# Patient Record
Sex: Female | Born: 1965 | Race: White | Hispanic: No | Marital: Married | Smoking: Never smoker
Health system: Southern US, Community
[De-identification: ages and names within clinical notes are randomized; demographics above are authoritative.]

## PROBLEM LIST (undated history)

## (undated) DIAGNOSIS — G43909 Migraine, unspecified, not intractable, without status migrainosus: Secondary | ICD-10-CM

## (undated) DIAGNOSIS — F32A Depression, unspecified: Secondary | ICD-10-CM

## (undated) DIAGNOSIS — N200 Calculus of kidney: Secondary | ICD-10-CM

## (undated) DIAGNOSIS — L509 Urticaria, unspecified: Secondary | ICD-10-CM

## (undated) DIAGNOSIS — F329 Major depressive disorder, single episode, unspecified: Secondary | ICD-10-CM

## (undated) DIAGNOSIS — IMO0002 Reserved for concepts with insufficient information to code with codable children: Principal | ICD-10-CM

## (undated) DIAGNOSIS — M810 Age-related osteoporosis without current pathological fracture: Secondary | ICD-10-CM

## (undated) DIAGNOSIS — L80 Vitiligo: Secondary | ICD-10-CM

## (undated) HISTORY — DX: Urticaria, unspecified: L50.9

## (undated) HISTORY — DX: Depression, unspecified: F32.A

## (undated) HISTORY — DX: Vitiligo: L80

## (undated) HISTORY — DX: Calculus of kidney: N20.0

## (undated) HISTORY — DX: Age-related osteoporosis without current pathological fracture: M81.0

## (undated) HISTORY — DX: Major depressive disorder, single episode, unspecified: F32.9

## (undated) HISTORY — DX: Migraine, unspecified, not intractable, without status migrainosus: G43.909

## (undated) HISTORY — DX: Reserved for concepts with insufficient information to code with codable children: IMO0002

---

## 1987-01-16 HISTORY — PX: OTHER SURGICAL HISTORY: SHX169

## 1988-01-16 HISTORY — PX: TUBAL LIGATION: SHX77

## 2001-02-20 ENCOUNTER — Other Ambulatory Visit: Admission: RE | Admit: 2001-02-20 | Discharge: 2001-02-20 | Payer: Self-pay | Admitting: Gynecology

## 2003-10-29 ENCOUNTER — Emergency Department (HOSPITAL_COMMUNITY): Admission: EM | Admit: 2003-10-29 | Discharge: 2003-10-29 | Payer: Self-pay

## 2004-11-16 ENCOUNTER — Encounter: Admission: RE | Admit: 2004-11-16 | Discharge: 2004-11-16 | Payer: Self-pay | Admitting: Family Medicine

## 2004-11-23 ENCOUNTER — Other Ambulatory Visit: Admission: RE | Admit: 2004-11-23 | Discharge: 2004-11-23 | Payer: Self-pay | Admitting: Gynecology

## 2006-02-22 ENCOUNTER — Other Ambulatory Visit: Admission: RE | Admit: 2006-02-22 | Discharge: 2006-02-22 | Payer: Self-pay | Admitting: Gynecology

## 2007-03-05 ENCOUNTER — Other Ambulatory Visit: Admission: RE | Admit: 2007-03-05 | Discharge: 2007-03-05 | Payer: Self-pay | Admitting: Gynecology

## 2008-03-11 ENCOUNTER — Ambulatory Visit: Payer: Self-pay | Admitting: Women's Health

## 2008-03-11 ENCOUNTER — Other Ambulatory Visit: Admission: RE | Admit: 2008-03-11 | Discharge: 2008-03-11 | Payer: Self-pay | Admitting: Gynecology

## 2008-03-11 ENCOUNTER — Encounter: Payer: Self-pay | Admitting: Women's Health

## 2008-03-23 ENCOUNTER — Ambulatory Visit: Payer: Self-pay | Admitting: Gynecology

## 2008-05-15 HISTORY — PX: VAGINAL HYSTERECTOMY: SUR661

## 2008-05-26 ENCOUNTER — Ambulatory Visit: Payer: Self-pay | Admitting: Gynecology

## 2008-05-31 ENCOUNTER — Encounter: Payer: Self-pay | Admitting: Gynecology

## 2008-05-31 ENCOUNTER — Ambulatory Visit: Payer: Self-pay | Admitting: Gynecology

## 2008-05-31 ENCOUNTER — Ambulatory Visit (HOSPITAL_BASED_OUTPATIENT_CLINIC_OR_DEPARTMENT_OTHER): Admission: RE | Admit: 2008-05-31 | Discharge: 2008-06-01 | Payer: Self-pay | Admitting: Gynecology

## 2008-06-15 ENCOUNTER — Ambulatory Visit: Payer: Self-pay | Admitting: Gynecology

## 2008-06-29 ENCOUNTER — Ambulatory Visit: Payer: Self-pay | Admitting: Gynecology

## 2009-01-27 ENCOUNTER — Ambulatory Visit: Payer: Self-pay | Admitting: Gynecology

## 2009-10-04 ENCOUNTER — Encounter: Admission: RE | Admit: 2009-10-04 | Discharge: 2009-10-04 | Payer: Self-pay | Admitting: Family Medicine

## 2009-12-19 ENCOUNTER — Other Ambulatory Visit
Admission: RE | Admit: 2009-12-19 | Discharge: 2009-12-19 | Payer: Self-pay | Source: Home / Self Care | Admitting: Gynecology

## 2009-12-19 ENCOUNTER — Ambulatory Visit: Payer: Self-pay | Admitting: Women's Health

## 2010-04-25 LAB — DIFFERENTIAL
Basophils Absolute: 0 10*3/uL (ref 0.0–0.1)
Basophils Relative: 0 % (ref 0–1)
Eosinophils Absolute: 0 10*3/uL (ref 0.0–0.7)
Eosinophils Relative: 0 % (ref 0–5)
Lymphocytes Relative: 19 % (ref 12–46)
Lymphs Abs: 1.7 10*3/uL (ref 0.7–4.0)
Monocytes Absolute: 0.6 10*3/uL (ref 0.1–1.0)
Monocytes Relative: 7 % (ref 3–12)
Neutro Abs: 6.3 10*3/uL (ref 1.7–7.7)
Neutrophils Relative %: 73 % (ref 43–77)

## 2010-04-25 LAB — CBC
HCT: 30.7 % — ABNORMAL LOW (ref 36.0–46.0)
Hemoglobin: 10.3 g/dL — ABNORMAL LOW (ref 12.0–15.0)
MCHC: 33.5 g/dL (ref 30.0–36.0)
MCV: 87.8 fL (ref 78.0–100.0)
Platelets: 198 10*3/uL (ref 150–400)
RBC: 3.49 MIL/uL — ABNORMAL LOW (ref 3.87–5.11)
RDW: 13.6 % (ref 11.5–15.5)
WBC: 8.6 10*3/uL (ref 4.0–10.5)

## 2010-05-30 NOTE — Op Note (Signed)
NAMECARIZMA, DUNSWORTH                 ACCOUNT NO.:  [id number]   MEDICAL RECORD NO.:  [id number]          PATIENT TYPE:  AMB   LOCATION:  [facility]                         FACILITY:  [facility]   PHYSICIAN:  Timothy P. Fontaine, M.D.DATE OF BIRTH:  Feb 16, 1965   DATE OF PROCEDURE:  05/31/2008  DATE OF DISCHARGE:                               OPERATIVE REPORT   PREOPERATIVE DIAGNOSES:  Menorrhagia, leiomyoma.   POSTOPERATIVE DIAGNOSES:  Menorrhagia, leiomyoma.   PROCEDURE:  Laparoscopic-assisted vaginal hysterectomy.   SURGEON:  Timothy P. Fontaine, M.D.   ASSISTANTGaetano Hawthorne. Lily Peer, M.D.   ANESTHETIC:  General, 0.25% Marcaine skin injection.   SPECIMEN:  Uterus, surgical weight 340 grams.   ESTIMATED BLOOD LOSS:  Approximately 200 mL.   COMPLICATIONS:  None.   FINDINGS:  EUA:  External BUS, vagina normal.  Cervix normal, uterus  bulky, consistent with leiomyoma, 12 weeks' size.  Adnexa without  masses.  Surgical:  Anterior cul-de-sac normal.  Posterior cul-de-sac  normal.  Uterus enlarged, deformed by multiple myomas.  No serosal  pathology.  Fallopian tubes with evidence of prior tubal sterilization,  otherwise normal.  Right and left ovaries grossly normal, free and  mobile.  No evidence of pelvic adhesive disease or endometriosis.  Upper  abdominal exam was normal without evidence of adhesive disease.   PROCEDURE:  The patient was taken to the operating room, underwent  general anesthesia, was placed in the low dorsal lithotomy position,  received an abdominal/perineal/vaginal preparation with Betadine  solution.  Bladder emptied with indwelling Foley catheterization.  EUA  performed and a Hulka tenaculum placed on the cervix.  The patient was  draped in the usual fashion and the abdomen was directly entered using  the 10-mm Optiview direct entry trocar under direct visualization  without difficulty and the abdomen was subsequently insufflated.  Right  and left 5-mm suprapubic  ports were then placed under direct  visualization after transillumination of the vessels without difficulty.  Examination of pelvic organs was then carried out with findings noted  above.  Using the Harmonic scalpel, the left uterine ovarian pedicle was  identified and transected without difficulty.  The parametrial broad  ligament was likewise transected and ultimately the left round ligament  was transected using the Harmonic scalpel.  The anterior vesicouterine  peritoneal fold was then sharply developed using the Harmonic scalpel  across the anterior lower uterine segment.  A similar procedure was then  carried out on the other side, meeting the peritoneal incisions in the  midline anteriorly.  Attention was then turned to the vaginal portion of  the case.  The patient was placed in the high dorsal lithotomy position,  Hulka tenaculum removed, weighted speculum placed.  The cervix grasped  with a tenaculum and the cervical mucosa was then circumferentially  injected using lidocaine and epinephrine mixture.  A total of 8 mL was  used.  The cervical mucosa was then circumferentially incised and the  paracervical plane sharply developed.  The posterior cul-de-sac was then  sharply entered without difficulty and a long weighted speculum was  placed.  The  right and left uterosacral ligaments were identified,  clamped, cut and ligated using 0 Vicryl suture and tagged for future  reference.  The anterior vesicouterine plane was developed sharply and  the anterior cul-de-sac was ultimately entered without difficulty.  The  cardinal ligaments, paracervical and parametrial tissues were then  clamped, cut and ligated bilaterally using 0 Vicryl suture,  progressively freeing the uterus.  After ligation of the uterine  vessels, it became evident that the uterus would need to be morcellated  to be removed from the patient, and the cervix was initially cored and  removed, and ultimately through  morcellation the uterus was removed in  pieces.  The remaining uterine attachments were clamped, cut and ligated  using 0 Vicryl suture, and the total uterus was ultimately removed from  the patient.  The long weighted speculum was replaced with the shorter  weighted speculum and the posterior vaginal cuff was run from  uterosacral ligament to uterosacral ligament using a 0 Vicryl suture in  a running interlocking stitch.  A tagged tail sponge was placed to pack  the intestines from the posterior cul-de-sac.  All pedicles were  inspected showing adequate hemostasis and the tail sponge was removed,  and ultimately the vagina was closed anterior to posterior using 0  Vicryl suture in interrupted figure-of-eight stitch.  Hemostasis was  visualized vaginally.  Attention was then redirected to the laparoscopic  portion.  The surgeon and scrubs regloved.  The abdomen was  reinsufflated and the pelvis was reinspected and irrigated.  Several  small oozing points along the vaginal cuff were bipolar cauterized  without difficulty.  All pedicles and cuff were reinspected under low  pressure situation showing adequate hemostasis.  The right and left 5-mm  ports were removed and again hemostasis visualized under low pressure  situation.  The 10-mm umbilical port was then backed out under direct  visualization, showing adequate hemostasis and no evidence of hernia  formation.  The 0 Vicryl interrupted subcutaneous fascial stitch was  placed infraumbilically and all skin incisions were closed using 4-0  plain suture in simple cuticular stitch.  The skin incisions were all  injected using 0.25% Marcaine.  The patient received intraoperative  Toradol.  She was awakened without difficulty and taken to the recovery  room in good condition, having tolerated the procedure well.      Timothy P. Fontaine, M.D.  Electronically Signed     TPF/MEDQ  D:  05/31/2008  T:  05/31/2008  Job:  [id number]

## 2010-05-30 NOTE — H&P (Signed)
Brianna Wolf, Brianna Wolf                 ACCOUNT NO.:  [id number]   MEDICAL RECORD NO.:  [id number]          PATIENT TYPE:  AMB   LOCATION:  [facility]                         FACILITY:  [facility]   PHYSICIAN:  Timothy P. Fontaine, M.D.DATE OF BIRTH:  Mar 07, 1965   DATE OF ADMISSION:  DATE OF DISCHARGE:                              HISTORY & PHYSICAL   CHIEF COMPLAINT:  Menorrhagia.   HISTORY OF PRESENT ILLNESS:  A 45 year old G35, P3 female status post  tubal sterilization presents with history of worsening menorrhagia  leading to 7 days of heavy bleeding, bleed through episodes, double  protection that socially unacceptable.  Outpatient evaluation included  sonohystogram which showed multiple myomas, the largest measuring 66 mm,  negative intracavitary defects and a negative endometrial biopsy.  Options for management were reviewed with the patient to include  observation, hormonal manipulation such as low-dose birth control pills,  Mirena IUD, progesterone only treatment, Depo-Lupron, uterine artery  embolization, endometrial ablation, myomectomy, hysterectomy.  After  lengthy discussion with her and her husband on several episodes she  wants to proceed with hysterectomy and she is admitted for LAVH.   PAST MEDICAL HISTORY:  Uncomplicated.   PAST SURGICAL HISTORY:  1. Tubal sterilization.  2. Kidney stone retrieval.   CURRENT MEDICATIONS:  Multivitamins.   ALLERGIES:  NO MEDICATIONS.   REVIEW OF SYSTEMS:  Noncontributory.   FAMILY HISTORY:  Noncontributory.   SOCIAL HISTORY:  Noncontributory.   PHYSICAL EXAM:  Afebrile, vital signs are stable.  HEENT: Normal.  LUNGS:  Clear.  CARDIAC:  Regular rate without rubs, murmurs or gallops.  ABDOMINAL:  Benign.  PELVIC:  External BUS, vagina normal.  Cervix normal.  Uterus bulky,  consistent with her myomas.  No tenderness.  Adnexa without gross masses  or tenderness.   ASSESSMENT:  A 45 year old G69, P3 female status post tubal  sterilization, myomas, menorrhagia socially unacceptable for LAVH.  The  risks, benefits, indications and alternatives for the procedure were  reviewed with the patient.  Options for nonsurgical versus more  conservative surgical options were discussed and she wants to proceed  with hysterectomy.  I reviewed the ovarian conservation issue with her,  options for keeping both ovaries or removing both ovaries were  discussed.  The issues of continued hormone production with retaining  her ovaries as well as the risk of benign ovarian disease requiring  reoperation or ovarian cancer in the future was discussed versus  removing both of her ovaries and the issues of hypoestrogenism both from  a symptom standpoint as well as the cardiovascular and osteoporosis  risks discussed.  The possibilities of ERT reviewed, WHI study, possible  risks to include increased risk of breast cancer, stroke, heart attack,  DVT, as well as less proven risks were reviewed and the patient wants to  keep both ovaries but she does give me permission to remove one or both  ovaries if at the time of surgery complications arise or there is  significant disease involving the ovary, she gives me permission remove  one or both ovaries.  The absolute irreversible sterility associated  with  hysterectomy was discussed, understood and accepted.  Sexuality  following hysterectomy and the potential for orgasmic dysfunction as  well as persistent dyspareunia was discussed, understood and accepted.  The acute intraoperative postoperative courses and risks were reviewed.  She understands the issues of instrumentation, multiple port sites,  insufflation, trocar placement, all reviewed with her.  The risk of  infection requiring prolonged antibiotics as well as risk of abscess  formation or hematoma formation requiring reoperation and drainage was  discussed, understood and accepted.  She understands we are going to  attempt a  laparoscopic approach but any time during the surgery if  complications arise or it is felt unsafe to proceed with the  laparoscopic approach then we will convert to an abdominal hysterectomy  with a larger incision and a longer recovery period.  The risks of  incisional complications discussed to include opening and draining of  incisions closure by secondary intention, long-term issues of hernia  formation, cosmetics discussed.  The risk of bleeding leading to  hemorrhage necessitating transfusion and risks of transfusion discussed  to include transfusion reaction, hepatitis, HIV, mad cow disease and  other unknown entities.  The risk of inadvertent injury to internal  organs including bowel, bladder, ureters, vessels and nerves either  immediately recognized or delay recognized leading to reoperation,  larger incisions, bowel resection, bowel repair, ostomy formation,  bladder repair, ureteral damage repair was all discussed, understood and  accepted.  The patient's questions were answered to her satisfaction and  she is ready to proceed with surgery.      Timothy P. Fontaine, M.D.  Electronically Signed     TPF/MEDQ  D:  05/26/2008  T:  05/26/2008  Job:  [id number]

## 2010-12-27 ENCOUNTER — Other Ambulatory Visit: Payer: Self-pay | Admitting: *Deleted

## 2010-12-27 MED ORDER — BUPROPION HCL ER (XL) 150 MG PO TB24: ORAL_TABLET | ORAL | Status: DC

## 2011-01-11 DIAGNOSIS — F329 Major depressive disorder, single episode, unspecified: Secondary | ICD-10-CM | POA: Insufficient documentation

## 2011-01-11 DIAGNOSIS — Z8679 Personal history of other diseases of the circulatory system: Secondary | ICD-10-CM | POA: Insufficient documentation

## 2011-01-11 DIAGNOSIS — F32A Depression, unspecified: Secondary | ICD-10-CM | POA: Insufficient documentation

## 2011-01-12 ENCOUNTER — Ambulatory Visit (INDEPENDENT_AMBULATORY_CARE_PROVIDER_SITE_OTHER): Payer: BC Managed Care – PPO | Admitting: Women's Health

## 2011-01-12 ENCOUNTER — Encounter: Payer: Self-pay | Admitting: Women's Health

## 2011-01-12 VITALS — BP 114/78 | Ht 63.0 in | Wt 139.0 lb

## 2011-01-12 DIAGNOSIS — F329 Major depressive disorder, single episode, unspecified: Secondary | ICD-10-CM

## 2011-01-12 DIAGNOSIS — F3289 Other specified depressive episodes: Secondary | ICD-10-CM

## 2011-01-12 DIAGNOSIS — Z01419 Encounter for gynecological examination (general) (routine) without abnormal findings: Secondary | ICD-10-CM

## 2011-01-12 DIAGNOSIS — F32A Depression, unspecified: Secondary | ICD-10-CM

## 2011-01-12 MED ORDER — BUPROPION HCL ER (XL) 150 MG PO TB24: ORAL_TABLET | ORAL | Status: DC

## 2011-01-12 NOTE — Progress Notes (Signed)
Brianna Wolf Jan 12, 1966 [id number]    History:    The patient presents for annual exam.  LAVH in 2010 for fibroids. History of normal Paps and mammograms. Is currently on  antibiotic for upper respiratory/sore throat her primary care.  Past medical history, past surgical history, family history and social history were all reviewed and documented in the EPIC chart. Medical sales representative.    A  ROS was performed and pertinent positives and negatives are included in the history.  Exam:  Filed Vitals:   01/12/11 1408  BP: 114/78    General appearance:  Normal Head/Neck:  Normal, without cervical or supraclavicular adenopathy. Thyroid:  Symmetrical, normal in size, without palpable masses or nodularity. Respiratory  Effort:  Normal  Auscultation:  Clear without wheezing or rhonchi Cardiovascular  Auscultation:  Regular rate, without rubs, murmurs or gallops  Edema/varicosities:  Not grossly evident Abdominal  Soft,nontender, without masses, guarding or rebound.  Liver/spleen:  No organomegaly noted  Hernia:  None appreciated  Skin  Inspection:  Grossly normal/Vetiligo  Palpation:  Grossly normal Neurologic/psychiatric  Orientation:  Normal with appropriate conversation.  Mood/affect:  Normal  Genitourinary    Breasts: Examined lying and sitting.     Right: Without masses, retractions, discharge or axillary adenopathy.     Left: Without masses, retractions, discharge or axillary adenopathy.   Inguinal/mons:  Normal without inguinal adenopathy  External genitalia:  Normal  BUS/Urethra/Skene's glands:  Normal  Bladder:  Normal  Vagina:  Normal  Cervix:  Absent   Uterus:  Absent  Adnexa/parametria:     Rt: Without masses or tenderness.   Lt: Without masses or tenderness.  Anus and perineum: Normal  Digital rectal exam: Normal sphincter tone without palpated masses or tenderness  Assessment/Plan:  45 y.o. MWF G3P3 for annual exam.   Normal GYN exam/LAVH with no menopausal  symptoms Depression stable on Wellbutrin URI-primary care  Plan: Wellbutrin 150 twice a day prescription, proper use, has done well on will continue, declines need for counseling at this time, has had in the past. SBEs, continue with annual screening, had this a.m. Very active job,  Exercise, calcium rich diet, vitamin D 1000 daily, fish oil supplement encouraged. History of normal lipid profile was slightly elevated triglycerides. Had normal labs at work and her primary care. Encouraged increase rest and fluids, return to primary care as needed for URI.    Harrington Challenger [facility], 3:11 PM 01/12/2011

## 2011-01-12 NOTE — Patient Instructions (Signed)
Vit d 1000 mg daily  Fish oil supplement

## 2011-01-15 ENCOUNTER — Telehealth: Payer: Self-pay | Admitting: *Deleted

## 2011-01-15 NOTE — Telephone Encounter (Signed)
Pt is calling to follow up with you regarding her cold/upper respiratory problem which she talked to you about on Friday. Pt said that it is not better and now she has a red rash all over her body. Pt was told to call if cold not better. Please advise

## 2011-01-15 NOTE — Telephone Encounter (Signed)
Message left to call office in regards to phone call

## 2011-01-15 NOTE — Telephone Encounter (Signed)
Telephone call, will try Benadryl for the rash has finished out the 14 day Septra for URI. States still feels achy with slight sore throat. Will use over-the-counter cough and cold remedies, states sleeping well. Denies fever.

## 2011-01-17 ENCOUNTER — Encounter: Payer: Self-pay | Admitting: Women's Health

## 2011-01-17 ENCOUNTER — Ambulatory Visit (INDEPENDENT_AMBULATORY_CARE_PROVIDER_SITE_OTHER): Payer: BC Managed Care – PPO | Admitting: Women's Health

## 2011-01-17 VITALS — Temp 99.6°F

## 2011-01-17 DIAGNOSIS — J029 Acute pharyngitis, unspecified: Secondary | ICD-10-CM

## 2011-01-17 MED ORDER — GUAIFENESIN-CODEINE 100-10 MG/5ML PO SYRP: 5.0000 mL | ORAL_SOLUTION | Freq: Three times a day (TID) | ORAL | Status: AC | PRN

## 2011-01-17 NOTE — Telephone Encounter (Signed)
Telephone call for followup, states is still ill. Sore throat, achy, cough, and generally feels terrible. Office visit

## 2011-01-17 NOTE — Progress Notes (Signed)
Patient ID: Brianna Wolf, female   DOB: 1966-01-13, 46 y.o.   MRN: [id number] Presents with a problem of sore throat and upper respiratory symptoms for greater than one month. Dry hacking cough. Has been on 2 weeks of Bactrim per primary care and amoxicillin per urgent care. Denies fever. States is extremely fatigued, sore throat, and generally not feeling well. Has had a rash on trunk that is resolving, most likely from 2 weeks on Bactrim. Has had 2 negative strep cultures.  Exam: Throat erythematous with no white patches, cervical lymphadenopathy present, lungs were clear throughout, appears to be not feeling well. Temperature today 99.6.  Sore throat not responsive to antibiotics/rule out mono.  Plan: Mono test. Reviewed if negative will refer to ear nose throat specialist for further investigation. Robitussin with codeine 2 teaspoons every 8 hours or at at bedtime when necessary for cough.

## 2011-01-18 ENCOUNTER — Other Ambulatory Visit: Payer: Self-pay | Admitting: *Deleted

## 2011-01-18 ENCOUNTER — Telehealth: Payer: Self-pay | Admitting: *Deleted

## 2011-01-18 DIAGNOSIS — J029 Acute pharyngitis, unspecified: Secondary | ICD-10-CM

## 2011-01-18 LAB — MONONUCLEOSIS SCREEN: Mono Screen: NEGATIVE

## 2011-01-18 NOTE — Telephone Encounter (Signed)
Message copied by Libby Maw on Thu Jan 18, 2011  2:32 PM ------      Message from: [place], [place] J      Created: Thu Jan 18, 2011  8:28 AM       Needs  appointment with ENT to evaluate persistent sore throat x1 month. Negative mono screen, 2 negative strep tests, has been on 2 different antibiotics with no relief.

## 2011-01-18 NOTE — Telephone Encounter (Signed)
Patient informed ENT appt set with Dr. Jearld Fenton on 01/24/11 @ 9:40 am.  Records faxed.

## 2011-01-19 ENCOUNTER — Encounter: Payer: Self-pay | Admitting: Women's Health

## 2012-01-28 ENCOUNTER — Other Ambulatory Visit: Payer: Self-pay | Admitting: *Deleted

## 2012-01-28 DIAGNOSIS — F329 Major depressive disorder, single episode, unspecified: Secondary | ICD-10-CM

## 2012-01-28 DIAGNOSIS — F32A Depression, unspecified: Secondary | ICD-10-CM

## 2012-01-28 MED ORDER — BUPROPION HCL ER (XL) 150 MG PO TB24: ORAL_TABLET | ORAL | Status: DC

## 2012-02-01 ENCOUNTER — Ambulatory Visit (INDEPENDENT_AMBULATORY_CARE_PROVIDER_SITE_OTHER): Payer: BC Managed Care – PPO | Admitting: Women's Health

## 2012-02-01 ENCOUNTER — Encounter: Payer: Self-pay | Admitting: Women's Health

## 2012-02-01 VITALS — BP 114/66 | Ht 63.25 in | Wt 134.0 lb

## 2012-02-01 DIAGNOSIS — F3289 Other specified depressive episodes: Secondary | ICD-10-CM

## 2012-02-01 DIAGNOSIS — F32A Depression, unspecified: Secondary | ICD-10-CM

## 2012-02-01 DIAGNOSIS — F329 Major depressive disorder, single episode, unspecified: Secondary | ICD-10-CM

## 2012-02-01 DIAGNOSIS — Z13220 Encounter for screening for lipoid disorders: Secondary | ICD-10-CM

## 2012-02-01 DIAGNOSIS — Z01419 Encounter for gynecological examination (general) (routine) without abnormal findings: Secondary | ICD-10-CM

## 2012-02-01 DIAGNOSIS — Z833 Family history of diabetes mellitus: Secondary | ICD-10-CM

## 2012-02-01 LAB — CBC WITH DIFFERENTIAL/PLATELET
Basophils Absolute: 0 10*3/uL (ref 0.0–0.1)
Basophils Relative: 0 % (ref 0–1)
Eosinophils Absolute: 0.4 10*3/uL (ref 0.0–0.7)
Eosinophils Relative: 8 % — ABNORMAL HIGH (ref 0–5)
HCT: 39.8 % (ref 36.0–46.0)
Hemoglobin: 13.4 g/dL (ref 12.0–15.0)
Lymphocytes Relative: 28 % (ref 12–46)
Lymphs Abs: 1.4 10*3/uL (ref 0.7–4.0)
MCH: 28.3 pg (ref 26.0–34.0)
MCHC: 33.7 g/dL (ref 30.0–36.0)
MCV: 84.1 fL (ref 78.0–100.0)
Monocytes Absolute: 0.6 10*3/uL (ref 0.1–1.0)
Monocytes Relative: 12 % (ref 3–12)
Neutro Abs: 2.6 10*3/uL (ref 1.7–7.7)
Neutrophils Relative %: 52 % (ref 43–77)
Platelets: 253 10*3/uL (ref 150–400)
RBC: 4.73 MIL/uL (ref 3.87–5.11)
RDW: 13.1 % (ref 11.5–15.5)
WBC: 5.1 10*3/uL (ref 4.0–10.5)

## 2012-02-01 MED ORDER — BUPROPION HCL ER (XL) 150 MG PO TB24: ORAL_TABLET | ORAL | Status: DC
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
## 2012-02-01 NOTE — Patient Instructions (Addendum)

## 2012-02-02 LAB — URINALYSIS W MICROSCOPIC + REFLEX CULTURE
Bacteria, UA: NONE SEEN
Bilirubin Urine: NEGATIVE
Casts: NONE SEEN
Crystals: NONE SEEN
Glucose, UA: NEGATIVE mg/dL
Hgb urine dipstick: NEGATIVE
Ketones, ur: NEGATIVE mg/dL
Leukocytes, UA: NEGATIVE
Nitrite: NEGATIVE
Protein, ur: NEGATIVE mg/dL
Specific Gravity, Urine: 1.006 (ref 1.005–1.030)
Squamous Epithelial / HPF: NONE SEEN
Urobilinogen, UA: 0.2 mg/dL (ref 0.0–1.0)
pH: 8 (ref 5.0–8.0)

## 2012-02-02 LAB — LIPID PANEL
Cholesterol: 183 mg/dL (ref 0–200)
HDL: 73 mg/dL (ref >39)
LDL Cholesterol: 93 mg/dL (ref 0–99)
Total CHOL/HDL Ratio: 2.5 Ratio
Triglycerides: 86 mg/dL (ref <150)
VLDL: 17 mg/dL (ref 0–40)

## 2012-02-02 LAB — GLUCOSE, RANDOM: Glucose, Bld: 72 mg/dL (ref 70–99)

## 2012-02-04 ENCOUNTER — Encounter: Payer: Self-pay | Admitting: Gynecology

## 2012-06-17 ENCOUNTER — Encounter: Payer: Self-pay | Admitting: Neurology

## 2012-06-17 ENCOUNTER — Ambulatory Visit (INDEPENDENT_AMBULATORY_CARE_PROVIDER_SITE_OTHER): Payer: BC Managed Care – PPO | Admitting: Neurology

## 2012-06-17 VITALS — BP 120/72 | HR 66 | Ht 64.0 in | Wt 138.0 lb

## 2012-06-17 DIAGNOSIS — IMO0002 Reserved for concepts with insufficient information to code with codable children: Secondary | ICD-10-CM | POA: Insufficient documentation

## 2012-06-17 HISTORY — DX: Reserved for concepts with insufficient information to code with codable children: IMO0002

## 2012-06-17 MED ORDER — PREDNISONE 10 MG PO TABS: ORAL_TABLET | ORAL | Status: DC
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
## 2012-06-26 ENCOUNTER — Ambulatory Visit (INDEPENDENT_AMBULATORY_CARE_PROVIDER_SITE_OTHER): Payer: BC Managed Care – PPO

## 2012-06-26 DIAGNOSIS — IMO0002 Reserved for concepts with insufficient information to code with codable children: Secondary | ICD-10-CM

## 2012-06-27 ENCOUNTER — Telehealth: Payer: Self-pay | Admitting: Neurology

## 2012-06-27 MED ORDER — GADOPENTETATE DIMEGLUMINE 469.01 MG/ML IV SOLN: 12.0000 mL | Freq: Once | INTRAVENOUS | Status: AC | PRN

## 2012-06-27 NOTE — Telephone Encounter (Signed)
I called patient. The MRI study of the brain shows nonspecific tiny white matter changes that could be consistent with a history of migraine headache. The patient indicates that the prednisone did offer benefit, but if the discomfort comes back after cessation of this medication, she will call our office and we will consider the use of Topamax.

## 2012-11-10 ENCOUNTER — Telehealth: Payer: Self-pay | Admitting: Neurology

## 2012-11-10 ENCOUNTER — Ambulatory Visit: Payer: Self-pay | Admitting: Neurology

## 2012-11-10 NOTE — Telephone Encounter (Signed)
This patient did not show up for his appointment.

## 2012-12-03 ENCOUNTER — Ambulatory Visit: Payer: BC Managed Care – PPO | Admitting: Neurology

## 2013-02-24 ENCOUNTER — Other Ambulatory Visit: Payer: Self-pay

## 2013-02-24 DIAGNOSIS — F329 Major depressive disorder, single episode, unspecified: Secondary | ICD-10-CM

## 2013-02-24 DIAGNOSIS — F32A Depression, unspecified: Secondary | ICD-10-CM

## 2013-02-24 MED ORDER — BUPROPION HCL ER (XL) 150 MG PO TB24: ORAL_TABLET | ORAL | Status: DC

## 2013-03-11 ENCOUNTER — Encounter: Payer: Self-pay | Admitting: Women's Health

## 2013-03-18 ENCOUNTER — Encounter: Payer: Self-pay | Admitting: Women's Health

## 2013-03-18 ENCOUNTER — Ambulatory Visit (INDEPENDENT_AMBULATORY_CARE_PROVIDER_SITE_OTHER): Payer: BC Managed Care – PPO | Admitting: Women's Health

## 2013-03-18 VITALS — BP 118/72 | Ht 63.25 in | Wt 138.6 lb

## 2013-03-18 DIAGNOSIS — F3289 Other specified depressive episodes: Secondary | ICD-10-CM

## 2013-03-18 DIAGNOSIS — Z833 Family history of diabetes mellitus: Secondary | ICD-10-CM

## 2013-03-18 DIAGNOSIS — F329 Major depressive disorder, single episode, unspecified: Secondary | ICD-10-CM

## 2013-03-18 DIAGNOSIS — F32A Depression, unspecified: Secondary | ICD-10-CM

## 2013-03-18 DIAGNOSIS — Z01419 Encounter for gynecological examination (general) (routine) without abnormal findings: Secondary | ICD-10-CM

## 2013-03-18 LAB — CBC WITH DIFFERENTIAL/PLATELET
Basophils Absolute: 0.1 10*3/uL (ref 0.0–0.1)
Basophils Relative: 1 % (ref 0–1)
Eosinophils Absolute: 0.5 10*3/uL (ref 0.0–0.7)
Eosinophils Relative: 9 % — ABNORMAL HIGH (ref 0–5)
HCT: 40.6 % (ref 36.0–46.0)
Hemoglobin: 13.6 g/dL (ref 12.0–15.0)
Lymphocytes Relative: 27 % (ref 12–46)
Lymphs Abs: 1.6 10*3/uL (ref 0.7–4.0)
MCH: 28 pg (ref 26.0–34.0)
MCHC: 33.5 g/dL (ref 30.0–36.0)
MCV: 83.5 fL (ref 78.0–100.0)
Monocytes Absolute: 0.5 10*3/uL (ref 0.1–1.0)
Monocytes Relative: 9 % (ref 3–12)
Neutro Abs: 3.3 10*3/uL (ref 1.7–7.7)
Neutrophils Relative %: 54 % (ref 43–77)
Platelets: 245 10*3/uL (ref 150–400)
RBC: 4.86 MIL/uL (ref 3.87–5.11)
RDW: 13.8 % (ref 11.5–15.5)
WBC: 6.1 10*3/uL (ref 4.0–10.5)

## 2013-03-18 MED ORDER — BUPROPION HCL ER (XL) 150 MG PO TB24: ORAL_TABLET | ORAL | Status: DC
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
## 2013-03-18 NOTE — Patient Instructions (Signed)
Health Recommendations for Postmenopausal Women Respected and ongoing research has looked at the most common causes of death, disability, and poor quality of life in postmenopausal women. The causes include heart disease, diseases of blood vessels, diabetes, depression, cancer, and bone loss (osteoporosis). Many things can be done to help lower the chances of developing these and other common problems: CARDIOVASCULAR DISEASE Heart Disease: A heart attack is a medical emergency. Know the signs and symptoms of a heart attack. Below are things women can do to reduce their risk for heart disease.   Do not smoke. If you smoke, quit.  Aim for a healthy weight. Being overweight causes many preventable deaths. Eat a healthy and balanced diet and drink an adequate amount of liquids.  Get moving. Make a commitment to be more physically active. Aim for 30 minutes of activity on most, if not all days of the week.  Eat for heart health. Choose a diet that is low in saturated fat and cholesterol and eliminate trans fat. Include whole grains, vegetables, and fruits. Read and understand the labels on food containers before buying.  Know your numbers. Ask your caregiver to check your blood pressure, cholesterol (total, HDL, LDL, triglycerides) and blood glucose. Work with your caregiver on improving your entire clinical picture.  High blood pressure. Limit or stop your table salt intake (try salt substitute and food seasonings). Avoid salty foods and drinks. Read labels on food containers before buying. Eating well and exercising can help control high blood pressure. STROKE  Stroke is a medical emergency. Stroke may be the result of a blood clot in a blood vessel in the brain or by a brain hemorrhage (bleeding). Know the signs and symptoms of a stroke. To lower the risk of developing a stroke:  Avoid fatty foods.  Quit smoking.  Control your diabetes, blood pressure, and irregular heart rate. THROMBOPHLEBITIS  (BLOOD CLOT) OF THE LEG  Becoming overweight and leading a stationary lifestyle may also contribute to developing blood clots. Controlling your diet and exercising will help lower the risk of developing blood clots. CANCER SCREENING  Breast Cancer: Take steps to reduce your risk of breast cancer.  You should practice "breast self-awareness." This means understanding the normal appearance and feel of your breasts and should include breast self-examination. Any changes detected, no matter how small, should be reported to your caregiver.  After age 40, you should have a clinical breast exam (CBE) every year.  Starting at age 40, you should consider having a mammogram (breast X-ray) every year.  If you have a family history of breast cancer, talk to your caregiver about genetic screening.  If you are at high risk for breast cancer, talk to your caregiver about having an MRI and a mammogram every year.  Intestinal or Stomach Cancer: Tests to consider are a rectal exam, fecal occult blood, sigmoidoscopy, and colonoscopy. Women who are high risk may need to be screened at an earlier age and more often.  Cervical Cancer:  Beginning at age 30, you should have a Pap test every 3 years as long as the past 3 Pap tests have been normal.  If you have had past treatment for cervical cancer or a condition that could lead to cancer, you need Pap tests and screening for cancer for at least 20 years after your treatment.  If you had a hysterectomy for a problem that was not cancer or a condition that could lead to cancer, then you no longer need Pap tests.    If you are between ages 65 and 70, and you have had normal Pap tests going back 10 years, you no longer need Pap tests.  If Pap tests have been discontinued, risk factors (such as a new sexual partner) need to be reassessed to determine if screening should be resumed.  Some medical problems can increase the chance of getting cervical cancer. In these  cases, your caregiver may recommend more frequent screening and Pap tests.  Uterine Cancer: If you have vaginal bleeding after reaching menopause, you should notify your caregiver.  Ovarian cancer: Other than yearly pelvic exams, there are no reliable tests available to screen for ovarian cancer at this time except for yearly pelvic exams.  Lung Cancer: Yearly chest X-rays can detect lung cancer and should be done on high risk women, such as cigarette smokers and women with chronic lung disease (emphysema).  Skin Cancer: A complete body skin exam should be done at your yearly examination. Avoid overexposure to the sun and ultraviolet light lamps. Use a strong sun block cream when in the sun. All of these things are important in lowering the risk of skin cancer. MENOPAUSE Menopause Symptoms: Hormone therapy products are effective for treating symptoms associated with menopause:  Moderate to severe hot flashes.  Night sweats.  Mood swings.  Headaches.  Tiredness.  Loss of sex drive.  Insomnia.  Other symptoms. Hormone replacement carries certain risks, especially in older women. Women who use or are thinking about using estrogen or estrogen with progestin treatments should discuss that with their caregiver. Your caregiver will help you understand the benefits and risks. The ideal dose of hormone replacement therapy is not known. The Food and Drug Administration (FDA) has concluded that hormone therapy should be used only at the lowest doses and for the shortest amount of time to reach treatment goals.  OSTEOPOROSIS Protecting Against Bone Loss and Preventing Fracture: If you use hormone therapy for prevention of bone loss (osteoporosis), the risks for bone loss must outweigh the risk of the therapy. Ask your caregiver about other medications known to be safe and effective for preventing bone loss and fractures. To guard against bone loss or fractures, the following is recommended:  If  you are less than age 50, take 1000 mg of calcium and at least 600 mg of Vitamin D per day.  If you are greater than age 50 but less than age 70, take 1200 mg of calcium and at least 600 mg of Vitamin D per day.  If you are greater than age 70, take 1200 mg of calcium and at least 800 mg of Vitamin D per day. Smoking and excessive alcohol intake increases the risk of osteoporosis. Eat foods rich in calcium and vitamin D and do weight bearing exercises several times a week as your caregiver suggests. DIABETES Diabetes Melitus: If you have Type I or Type 2 diabetes, you should keep your blood sugar under control with diet, exercise and recommended medication. Avoid too many sweets, starchy and fatty foods. Being overweight can make control more difficult. COGNITION AND MEMORY Cognition and Memory: Menopausal hormone therapy is not recommended for the prevention of cognitive disorders such as Alzheimer's disease or memory loss.  DEPRESSION  Depression may occur at any age, but is common in elderly women. The reasons may be because of physical, medical, social (loneliness), or financial problems and needs. If you are experiencing depression because of medical problems and control of symptoms, talk to your caregiver about this. Physical activity and   exercise may help with mood and sleep. Community and volunteer involvement may help your sense of value and worth. If you have depression and you feel that the problem is getting worse or becoming severe, talk to your caregiver about treatment options that are best for you. ACCIDENTS  Accidents are common and can be serious in the elderly woman. Prepare your house to prevent accidents. Eliminate throw rugs, place hand bars in the bath, shower and toilet areas. Avoid wearing high heeled shoes or walking on wet, snowy, and icy areas. Limit or stop driving if you have vision or hearing problems, or you feel you are unsteady with you movements and  reflexes. HEPATITIS C Hepatitis C is a type of viral infection affecting the liver. It is spread mainly through contact with blood from an infected person. It can be treated, but if left untreated, it can lead to severe liver damage over years. Many people who are infected do not know that the virus is in their blood. If you are a "baby-boomer", it is recommended that you have one screening test for Hepatitis C. IMMUNIZATIONS  Several immunizations are important to consider having during your senior years, including:   Tetanus, diptheria, and pertussis booster shot.  Influenza every year before the flu season begins.  Pneumonia vaccine.  Shingles vaccine.  Others as indicated based on your specific needs. Talk to your caregiver about these. Document Released: 02/23/2005 Document Revised: 12/19/2011 Document Reviewed: 10/20/2007 ExitCare Patient Information 2014 [employer].  

## 2013-03-19 LAB — URINALYSIS W MICROSCOPIC + REFLEX CULTURE
Bacteria, UA: NONE SEEN
Bilirubin Urine: NEGATIVE
Casts: NONE SEEN
Crystals: NONE SEEN
Glucose, UA: NEGATIVE mg/dL
Hgb urine dipstick: NEGATIVE
Ketones, ur: NEGATIVE mg/dL
Leukocytes, UA: NEGATIVE
Nitrite: NEGATIVE
Protein, ur: NEGATIVE mg/dL
Specific Gravity, Urine: 1.008 (ref 1.005–1.030)
Squamous Epithelial / HPF: NONE SEEN
Urobilinogen, UA: 0.2 mg/dL (ref 0.0–1.0)
pH: 7 (ref 5.0–8.0)

## 2013-03-19 LAB — GLUCOSE, RANDOM: Glucose, Bld: 77 mg/dL (ref 70–99)

## 2013-03-20 ENCOUNTER — Other Ambulatory Visit: Payer: Self-pay | Admitting: Women's Health

## 2013-03-20 ENCOUNTER — Encounter: Payer: Self-pay | Admitting: Women's Health

## 2013-03-20 DIAGNOSIS — F411 Generalized anxiety disorder: Secondary | ICD-10-CM

## 2013-03-20 MED ORDER — ALPRAZOLAM 0.25 MG PO TABS: 0.2500 mg | ORAL_TABLET | Freq: Every evening | ORAL | Status: DC | PRN

## 2013-03-20 NOTE — Progress Notes (Signed)
telephone call from patient having increased anxiety due to to stressful work situation, reports panic attack yesterday requesting something to help has had  counseling in the past. Xanax 0.25 at bedtime when necessary #30 with 1 refill generic

## 2013-04-27 ENCOUNTER — Encounter: Payer: Self-pay | Admitting: Women's Health

## 2013-04-28 ENCOUNTER — Encounter: Payer: Self-pay | Admitting: Women's Health

## 2013-11-16 ENCOUNTER — Encounter: Payer: Self-pay | Admitting: Women's Health

## 2014-03-27 ENCOUNTER — Other Ambulatory Visit: Payer: Self-pay | Admitting: Women's Health

## 2014-04-06 ENCOUNTER — Ambulatory Visit (INDEPENDENT_AMBULATORY_CARE_PROVIDER_SITE_OTHER): Payer: BLUE CROSS/BLUE SHIELD | Admitting: Women's Health

## 2014-04-06 ENCOUNTER — Encounter: Payer: Self-pay | Admitting: Women's Health

## 2014-04-06 VITALS — BP 119/72 | Ht 63.0 in | Wt 145.0 lb

## 2014-04-06 DIAGNOSIS — F329 Major depressive disorder, single episode, unspecified: Secondary | ICD-10-CM

## 2014-04-06 DIAGNOSIS — Z13220 Encounter for screening for lipoid disorders: Secondary | ICD-10-CM

## 2014-04-06 DIAGNOSIS — Z01419 Encounter for gynecological examination (general) (routine) without abnormal findings: Secondary | ICD-10-CM | POA: Diagnosis not present

## 2014-04-06 DIAGNOSIS — F32A Depression, unspecified: Secondary | ICD-10-CM

## 2014-04-06 LAB — COMPREHENSIVE METABOLIC PANEL
ALT: 17 U/L (ref 0–35)
AST: 17 U/L (ref 0–37)
Albumin: 4.3 g/dL (ref 3.5–5.2)
Alkaline Phosphatase: 66 U/L (ref 39–117)
BUN: 10 mg/dL (ref 6–23)
CO2: 31 mEq/L (ref 19–32)
Calcium: 8.9 mg/dL (ref 8.4–10.5)
Chloride: 104 mEq/L (ref 96–112)
Creat: 0.87 mg/dL (ref 0.50–1.10)
Glucose, Bld: 89 mg/dL (ref 70–99)
Potassium: 3.1 mEq/L — ABNORMAL LOW (ref 3.5–5.3)
Sodium: 140 mEq/L (ref 135–145)
Total Bilirubin: 0.4 mg/dL (ref 0.2–1.2)
Total Protein: 6.2 g/dL (ref 6.0–8.3)

## 2014-04-06 LAB — LIPID PANEL
Cholesterol: 167 mg/dL (ref 0–200)
HDL: 64 mg/dL (ref >=46)
LDL Cholesterol: 87 mg/dL (ref 0–99)
Total CHOL/HDL Ratio: 2.6 Ratio
Triglycerides: 80 mg/dL (ref <150)
VLDL: 16 mg/dL (ref 0–40)

## 2014-04-06 LAB — CBC WITH DIFFERENTIAL/PLATELET
Basophils Absolute: 0 10*3/uL (ref 0.0–0.1)
Basophils Relative: 0 % (ref 0–1)
Eosinophils Absolute: 0.4 10*3/uL (ref 0.0–0.7)
Eosinophils Relative: 8 % — ABNORMAL HIGH (ref 0–5)
HCT: 38.7 % (ref 36.0–46.0)
Hemoglobin: 12.9 g/dL (ref 12.0–15.0)
Lymphocytes Relative: 27 % (ref 12–46)
Lymphs Abs: 1.5 10*3/uL (ref 0.7–4.0)
MCH: 27.7 pg (ref 26.0–34.0)
MCHC: 33.3 g/dL (ref 30.0–36.0)
MCV: 83.2 fL (ref 78.0–100.0)
MPV: 10.9 fL (ref 8.6–12.4)
Monocytes Absolute: 0.5 10*3/uL (ref 0.1–1.0)
Monocytes Relative: 9 % (ref 3–12)
Neutro Abs: 3 10*3/uL (ref 1.7–7.7)
Neutrophils Relative %: 56 % (ref 43–77)
Platelets: 215 10*3/uL (ref 150–400)
RBC: 4.65 MIL/uL (ref 3.87–5.11)
RDW: 13.6 % (ref 11.5–15.5)
WBC: 5.4 10*3/uL (ref 4.0–10.5)

## 2014-04-06 MED ORDER — BUPROPION HCL ER (XL) 150 MG PO TB24: 150.0000 mg | ORAL_TABLET | Freq: Two times a day (BID) | ORAL | Status: DC

## 2014-04-06 NOTE — Patient Instructions (Signed)
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
## 2014-04-07 ENCOUNTER — Encounter: Payer: Self-pay | Admitting: Gynecology

## 2014-04-07 LAB — TSH: TSH: 2.769 u[IU]/mL (ref 0.350–4.500)

## 2014-04-08 ENCOUNTER — Other Ambulatory Visit: Payer: Self-pay | Admitting: *Deleted

## 2014-04-08 DIAGNOSIS — E876 Hypokalemia: Secondary | ICD-10-CM

## 2014-05-10 ENCOUNTER — Other Ambulatory Visit: Payer: BLUE CROSS/BLUE SHIELD

## 2014-09-10 ENCOUNTER — Telehealth: Payer: Self-pay | Admitting: *Deleted

## 2014-09-10 MED ORDER — BUPROPION HCL ER (XL) 300 MG PO TB24: 300.0000 mg | ORAL_TABLET | Freq: Every day | ORAL | Status: DC

## 2014-09-10 NOTE — Telephone Encounter (Signed)
Brianna Wolf pt insurance will not longer pay for Wellbutrin XL 150 mg twice daily #60 , but they will pay for Wellbutrin XL 300 mg one po daily. Rx will be sent.

## 2015-03-08 ENCOUNTER — Emergency Department (HOSPITAL_COMMUNITY)
Admission: EM | Admit: 2015-03-08 | Discharge: 2015-03-08 | Disposition: A | Discharge status: Home / Self Care | Payer: BLUE CROSS/BLUE SHIELD | Attending: Emergency Medicine | Admitting: Emergency Medicine

## 2015-03-08 ENCOUNTER — Encounter (HOSPITAL_COMMUNITY): Payer: Self-pay | Admitting: Emergency Medicine

## 2015-03-08 DIAGNOSIS — Z87442 Personal history of urinary calculi: Secondary | ICD-10-CM | POA: Insufficient documentation

## 2015-03-08 DIAGNOSIS — R1310 Dysphagia, unspecified: Secondary | ICD-10-CM | POA: Diagnosis present

## 2015-03-08 DIAGNOSIS — Z872 Personal history of diseases of the skin and subcutaneous tissue: Secondary | ICD-10-CM | POA: Insufficient documentation

## 2015-03-08 DIAGNOSIS — F329 Major depressive disorder, single episode, unspecified: Secondary | ICD-10-CM | POA: Diagnosis not present

## 2015-03-08 DIAGNOSIS — Z8739 Personal history of other diseases of the musculoskeletal system and connective tissue: Secondary | ICD-10-CM | POA: Insufficient documentation

## 2015-03-08 DIAGNOSIS — Z79899 Other long term (current) drug therapy: Secondary | ICD-10-CM | POA: Insufficient documentation

## 2015-03-08 DIAGNOSIS — G43909 Migraine, unspecified, not intractable, without status migrainosus: Secondary | ICD-10-CM | POA: Diagnosis not present

## 2015-03-08 NOTE — ED Notes (Signed)
Patient d/c'd self care.  F/U reviewed.  Patient verbalized understanding. 
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
## 2015-03-08 NOTE — Discharge Instructions (Signed)
Dysphagia Take Prilosec OTC as directed. Drink at least six 8 ounce glasses of water daily. Call [facility] gastro[facility] tomorrow to schedule the next available appointment Swallowing problems (dysphagia) occur when solids and liquids seem to stick in your throat on the way down to your stomach, or the food takes longer to get to the stomach. Other symptoms include regurgitating food, noises coming from the throat, chest discomfort with swallowing, and a feeling of fullness or the feeling of something being stuck in your throat when swallowing. When blockage in your throat is complete, it may be associated with drooling. CAUSES  Problems with swallowing may occur because of problems with the muscles. The food cannot be propelled in the usual manner into your stomach. You may have ulcers, scar tissue, or inflammation in the tube down which food travels from your mouth to your stomach (esophagus), which blocks food from passing normally into the stomach. Causes of inflammation include:  Acid reflux from your stomach into your esophagus.  Infection.  Radiation treatment for cancer.  Medicines taken without enough fluids to wash them down into your stomach. You may have nerve problems that prevent signals from being sent to the muscles of your esophagus to contract and move your food down to your stomach. Globus pharyngeus is a relatively common problem in which there is a sense of an obstruction or difficulty in swallowing, without any physical abnormalities of the swallowing passages being found. This problem usually improves over time with reassurance and testing to rule out other causes. DIAGNOSIS Dysphagia can be diagnosed and its cause can be determined by tests in which you swallow a white substance that helps illuminate the inside of your throat (contrast medium) while X-rays are taken. Sometimes a flexible telescope that is inserted down your throat (endoscopy) to look at your esophagus and  stomach is used. TREATMENT   If the dysphagia is caused by acid reflux or infection, medicines may be used.  If the dysphagia is caused by problems with your swallowing muscles, swallowing therapy may be used to help you strengthen your swallowing muscles.  If the dysphagia is caused by a blockage or mass, procedures to remove the blockage may be done. HOME CARE INSTRUCTIONS  Try to eat soft food that is easier to swallow and check your weight on a daily basis to be sure that it is not decreasing.  Be sure to drink liquids when sitting upright (not lying down). SEEK MEDICAL CARE IF:  You are losing weight because you are unable to swallow.  You are coughing when you drink liquids (aspiration).  You are coughing up partially digested food. SEEK IMMEDIATE MEDICAL CARE IF:  You are unable to swallow your own saliva .  You are having shortness of breath or a fever, or both.  You have a hoarse voice along with difficulty swallowing. MAKE SURE YOU:  Understand these instructions.  Will watch your condition.  Will get help right away if you are not doing well or get worse.   This information is not intended to replace advice given to you by your health care provider. Make sure you discuss any questions you have with your health care provider.   Document Released: 12/30/1999 Document Revised: 01/22/2014 Document Reviewed: 06/20/2012 Elsevier Interactive Patient Education [employer].

## 2015-03-08 NOTE — ED Notes (Signed)
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

## 2015-03-16 ENCOUNTER — Other Ambulatory Visit: Payer: Self-pay | Admitting: Physician Assistant

## 2015-03-16 ENCOUNTER — Other Ambulatory Visit: Payer: Self-pay | Admitting: Gastroenterology

## 2015-03-16 DIAGNOSIS — R1310 Dysphagia, unspecified: Secondary | ICD-10-CM

## 2015-03-18 ENCOUNTER — Ambulatory Visit
Admission: RE | Admit: 2015-03-18 | Discharge: 2015-03-18 | Disposition: A | Discharge status: Home / Self Care | Payer: BLUE CROSS/BLUE SHIELD | Source: Ambulatory Visit | Attending: Physician Assistant | Admitting: Physician Assistant

## 2015-03-18 DIAGNOSIS — R1310 Dysphagia, unspecified: Secondary | ICD-10-CM

## 2015-03-30 ENCOUNTER — Other Ambulatory Visit: Payer: Self-pay | Admitting: Women's Health

## 2015-03-31 ENCOUNTER — Other Ambulatory Visit: Payer: Self-pay | Admitting: Gastroenterology

## 2015-04-19 DIAGNOSIS — R002 Palpitations: Secondary | ICD-10-CM | POA: Diagnosis not present

## 2015-04-27 ENCOUNTER — Ambulatory Visit (INDEPENDENT_AMBULATORY_CARE_PROVIDER_SITE_OTHER): Payer: BLUE CROSS/BLUE SHIELD | Admitting: Women's Health

## 2015-04-27 ENCOUNTER — Encounter: Payer: Self-pay | Admitting: Women's Health

## 2015-04-27 VITALS — BP 122/80 | Ht 63.0 in | Wt 141.0 lb

## 2015-04-27 DIAGNOSIS — Z01419 Encounter for gynecological examination (general) (routine) without abnormal findings: Secondary | ICD-10-CM

## 2015-04-27 DIAGNOSIS — F329 Major depressive disorder, single episode, unspecified: Secondary | ICD-10-CM

## 2015-04-27 DIAGNOSIS — F32A Depression, unspecified: Secondary | ICD-10-CM

## 2015-04-27 DIAGNOSIS — Z1231 Encounter for screening mammogram for malignant neoplasm of breast: Secondary | ICD-10-CM | POA: Diagnosis not present

## 2015-04-27 MED ORDER — BUPROPION HCL ER (XL) 300 MG PO TB24: ORAL_TABLET | ORAL | Status: DC

## 2015-04-27 NOTE — Progress Notes (Signed)
Brianna Wolf 52-Jan-1967 [id number]    History:    Presents for annual exam.  2010 [facility] for fibroids on no HRT. Normal Pap and mammogram history. 03/2015 benign colon polyp to return in 5 years for follow-up. Long-term history of vitiligo. Depression stable on Wellbutrin. Husband prostate cancer/radiation doing better able to have intercourse. 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 Has follow-up scheduled.  Past medical history, past surgical history, family history and social history were all reviewed and documented in the EPIC chart. Works at [employer] in [place]. Vegetarian. Parents hypertension. Has 3 children all doing well.  ROS:  A ROS was performed and pertinent positives and negatives are included.  Exam:  Filed Vitals:   04/27/15 1011  BP: 122/80    General appearance:  Normal Thyroid:  Symmetrical, normal in size, without palpable masses or nodularity. Respiratory  Auscultation:  Clear without wheezing or rhonchi Cardiovascular  Auscultation:  Regular rate, without rubs, murmurs or gallops  Edema/varicosities:  Not grossly evident Abdominal  Soft,nontender, without masses, guarding or rebound.  Liver/spleen:  No organomegaly noted  Hernia:  None appreciated  Skin  Inspection:  Grossly normal   Breasts: Examined lying and sitting.     Right: Without masses, retractions, discharge or axillary adenopathy.     Left: Without masses, retractions, discharge or axillary adenopathy. Gentitourinary   Inguinal/mons:  Normal without inguinal adenopathy  External genitalia:  Normal  BUS/Urethra/Skene's glands:  Normal  Vagina:  Normal  Cervix:  And uterus absent Adnexa/parametria:     Rt: Without masses or tenderness.   Lt: Without masses or tenderness.  Anus and perineum: Normal  Digital rectal exam: Normal sphincter tone without palpated masses or tenderness  Assessment/Plan:  50 y.o. MWF G3  P3 for annual exam.     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 Denies need for counseling at this time. SBE's, continue annual 3-D screening mammogram history of dense breasts. Continue active lifestyle, gets approximately 15,000-20,000 steps daily at work/day. Calcium rich foods, vitamin D 1000 daily encouraged.    [name], 11:46 AM 04/27/2015

## 2015-04-27 NOTE — Patient Instructions (Addendum)
Gluten-Free Diet for Celiac Disease Gluten is a protein found in wheat, rye, barley, and triticale (a cross between wheat and rye) grains. People with celiac disease need to have a gluten-free diet. With celiac disease, gluten interferes with the absorption of food and may also cause intestinal injury.  Strict compliance is important even during symptom-free periods. This means eliminating all foods with gluten from your diet permanently. This requires some significant changes but is very manageable. WHAT DO I NEED TO KNOW ABOUT A GLUTEN-FREE DIET?  Look for items labeled with "GF." Looking for GF will make it easier to identify products that are safe to eat.  Read all labels. Gluten may have been added as a minor ingredient where least expected, such as in shredded cheeses or ice creams. Always check food labels and investigate questionable ingredients. Talk to your dietitian or health care provider if you have questions about certain foods or need help finding GF foods.  Check when in doubt. If you are not sure whether an ingredient contains gluten, check with the manufacturer. Note that some manufacturers may change ingredients without notice. Always read labels.   Know how food is prepared. Since flour and cereal products are often used in the preparation of foods, it is important to be aware of the methods of preparation used, as well as the ingredients in the foods themselves. This is especially true when you are dining out. Ask restaurants if they have a gluten-free menu.  Watch for cross-contamination. Cross-contamination occurs when gluten-free foods come into contact with foods that contain gluten. It often happens during the manufacturing process. Always check the ingredient list and for warnings on packages, such as "may contain gluten."  Eat a balanced diet. It is important to still get enough fiber, iron, and B vitamins in your diet. Look for enriched whole grain gluten-free products  and continue to eat a well-balanced diet of the important non-grain items, such as vegetables, fruit, lean proteins, legumes, and dairy.  Consider taking a gluten-free multivitamin and mineral supplement. Discuss this with your health care provider. WHAT KEY WORDS HELP IDENTIFY GLUTEN? Know key words to help identify gluten. A dietitian can help you identify possible harmful ingredients in the foods you normally eat. Words to check for on food labels include:   Flour, enriched flour, bromated flour, white flour, durum flour, graham flour, phosphated flour, self-rising flour, semolina, or farina.  Starch, dextrin, modified food starch, or cereal.  Thickening, fillers, or emulsifiers.  Any kind of malt flavoring, extract, or syrup (malt is made from barley and includes malt vinegar, malted milk, and malted beverages).  Hydrolyzed vegetable protein. WHAT FOODS CAN I EAT? Below is a list of common foods that are allowed with a gluten-free diet.  Grains Products made from the following flours or grains:amaranth,bean flours, 100% buckwheat flour, corn, millet, nut flours or meals, GF oats, quinoa, rice, sorghum, teff, any all-purpose 100% GF flour mix, rice wafers, pure cornmeal tortillas, popcorn, some crackers, some chips, and hot cereals made from cornmeal. Ask your dietitian which specific hot and cold cereals are allowed. Hominy, rice or wild rice, and special GF pasta. Some Asian rice noodles or bean noodles. Arrowroot starch, corn bran, corn flour, corn germ, cornmeal, corn starch, potato flour, potato starch flour, and rice bran. Rice flours: plain, brown, and sweet. Rice polish, soy flour, tapioca starch. Vegetables All plain, fresh, frozen, or canned vegetables.  Fruits All fresh, frozen, canned, dried fruits, and fruit juices.  Meats and Other   Protein Foods Meat, fish, poultry, or eggs prepared without added wheat, rye, barley, or triticale. Some luncheon meat and some frankfurters.  Pure meat. All aged cheese, most processed cheese products, some cottage cheese, and some cream cheese. Dried beans, dried peas, and lentils.  Dairy Milk and yogurt made with allowed ingredients.  Beverages Coffee (regular or decaffeinated), tea, herbal tea (read label to be sure that no wheat flour has been added). Carbonated beverages and some root beers. Wine, sake, and distilled spirits, such as gin, vodka, and whiskey. GF beers and GF ciders.  Sweetsand Desserts Sugar, honey, some syrups, molasses, jelly, jam, plain hard candy, marshmallows, gumdrops, homemade candies free of wheat, rye, barley, or triticale. Coconut. Custard, some pudding mixes, and homemade puddings from cornstarch, rice, and tapioca. Gelatin desserts, sorbets, frozen ice pops, and sherbet. Cake, cookies, and other desserts prepared with allowed flours. Some commercial ice creams. Ask your dietitian about specific brands of dessert that are allowed.  Fats and Oils Butter, margarine, vegetable oil, sour cream not containing modified food starch, whipping cream, shortening, lard, cream, and some mayonnaise. Some commercial salad dressings. Peanut butter.  Other Homemade broth and soups made with allowed ingredients; some canned or frozen soups. Any other combination or prepared foods that do not contain gluten. Monosodium glutamate (MSG). Cider, rice, and wine vinegar. Baking soda and baking powder. Certain soy sauces (Tamari). Ask your dietitian about specific brands that are allowed. Nuts, coconut, chocolate, and pure cocoa powder. Salt, pepper, herbs, spices, extracts, and food colorings. The items listed above may not be a complete list of allowed foods or beverages. Contact your dietitian for more options.  WHAT FOODS CAN I NOT EAT? Below is a list of common foods that are not allowed with a gluten-free diet.  Grains Barley, bran, bulgur, cracked wheat, graham, malt, matzo, wheat germ, and all wheat and rye cereals  including spelt and kamut. Avoid cereals containing malt as a flavoring, such as rice cereal. Also avoid regular noodles, spaghetti, macaroni, and most packaged rice mixes, and all others containing wheat, rye, barley, or triticale.  Vegetables Most creamed vegetables, most vegetables canned in sauces, and any vegetables prepared with wheat, rye, barley, or triticale.  Fruits Thickened or prepared fruits and some pie fillings.  Meats and Other Protein Sources Any meat or meat alternative containing wheat, rye, barley, or gluten stabilizers (such as some hot dogs, salami, cold cuts, or sausage). Bread-containing products, such as Swiss steak, croquettes, and meatloaf. Most tuna canned in vegetable broth, Brianna Wolf with hydrolyzed vegetable protein (HVP) injected as part of the basting, and any cheese product containing oat gum as an ingredient. Seitan. Imitation fish. Dairy Commercial chocolate milk, which may have cereal added, and malted milk. Beverages Certain cereal beverages. Beer and ciders (unless GF), ale, malted milk, and some root beers. Sweetsand Desserts Commercial candies containing wheat, rye, barley, or triticale. Certain toffees are dusted with wheat flour. Chocolate-coated nuts, which are often rolled in flour. Cakes, cookies, doughnuts, and pastries that are prepared with wheat, barley, rye, or triticale flour. Some commercial ice creams, ice cream flavors which contain cookies, crumbs, or cheesecake. Ice cream cones. Commercially prepared mixes for cakes, cookies, and other desserts unless marked GF. Bread pudding and other puddings thickened with flour. Fats and Oils Some commercial salad dressings and sour cream containing modified food starch.  Condiments Some curry powder, some dry seasoning mixes, some gravy extracts, some meat sauces, some ketchup, some prepared mustard, horseradish. Other All soups containing wheat,  rye, barley, or triticale flour. Bouillon and bouillon  cubes that contain HVP. Combination or prepared foods that contain gluten. Some soy sauce, some chip dips, and some chewing gum. Yeast extract (contains barley). Caramel color (may contain malt). The items listed above may not be a complete list of foods and beverages to avoid. Contact your dietitian for more information.   This information is not intended to replace advice given to you by your health care provider. Make sure you discuss any questions you have with your health care provider.   Document Released: 01/01/2005 Document Revised: 01/22/2014 Document Reviewed: 11/05/2012 Elsevier Interactive Patient Education [employer]. Brianna Wolf is a normal process in which your reproductive ability comes to an end. This process happens gradually over a span of months to years, usually between the ages of 74 and 18. Brianna Wolf is complete when you have missed 12 consecutive menstrual periods. It is important to talk with your health care provider about some of the most common conditions that affect postmenopausal women, such as heart disease, cancer, and bone loss (osteoporosis). Adopting a healthy lifestyle and getting preventive care can help to promote your health and wellness. Those actions can also lower your chances of developing some of these common conditions. WHAT SHOULD I KNOW ABOUT Brianna Wolf? During Brianna Wolf, you may experience a number of symptoms, such as:  Moderate-to-severe hot flashes.  Night sweats.  Decrease in sex drive.  Mood swings.  Headaches.  Tiredness.  Irritability.  Memory problems.  Insomnia. Choosing to treat or not to treat menopausal changes is an individual decision that you make with your health care provider. WHAT SHOULD I KNOW ABOUT HORMONE REPLACEMENT THERAPY AND SUPPLEMENTS? Hormone therapy products are effective for treating symptoms that are associated with Brianna Wolf, such as hot flashes and night sweats. Hormone replacement carries certain  risks, especially as you become older. If you are thinking about using estrogen or estrogen with progestin treatments, discuss the benefits and risks with your health care provider. WHAT SHOULD I KNOW ABOUT HEART DISEASE AND STROKE? Heart disease, heart attack, and stroke become more likely as you age. This may be due, in part, to the hormonal changes that your body experiences during Brianna Wolf. These can affect how your body processes dietary fats, triglycerides, and cholesterol. Heart attack and stroke are both medical emergencies. There are many things that you can do to help prevent heart disease and stroke:  Have your blood pressure checked at least every 1-2 years. High blood pressure causes heart disease and increases the risk of stroke.  If you are 28-83 years old, ask your health care provider if you should take aspirin to prevent a heart attack or a stroke.  Do not use any tobacco products, including cigarettes, chewing tobacco, or electronic cigarettes. If you need help quitting, ask your health care provider.  It is important to eat a healthy diet and maintain a healthy weight.  Be sure to include plenty of vegetables, fruits, low-fat dairy products, and lean protein.  Avoid eating foods that are high in solid fats, added sugars, or salt (sodium).  Get regular exercise. This is one of the most important things that you can do for your health.  Try to exercise for at least 150 minutes each week. The type of exercise that you do should increase your heart rate and make you sweat. This is known as moderate-intensity exercise.  Try to do strengthening exercises at least twice each week. Do these in addition to the moderate-intensity exercise.  Know your numbers.Ask your health care provider to check your cholesterol and your blood glucose. Continue to have your blood tested as directed by your health care provider. WHAT SHOULD I KNOW ABOUT CANCER SCREENING? There are several types  of cancer. Take the following steps to reduce your risk and to catch any cancer development as early as possible. Breast Cancer  Practice breast self-awareness.  This means understanding how your breasts normally appear and feel.  It also means doing regular breast self-exams. Let your health care provider know about any changes, no matter how small.  If you are 35 or older, have a clinician do a breast exam (clinical breast exam or CBE) every year. Depending on your age, family history, and medical history, it may be recommended that you also have a yearly breast X-ray (mammogram).  If you have a family history of breast cancer, talk with your health care provider about genetic screening.  If you are at high risk for breast cancer, talk with your health care provider about having an MRI and a mammogram every year.  Breast cancer (BRCA) gene test is recommended for women who have family members with BRCA-related cancers. Results of the assessment will determine the need for genetic counseling and BRCA1 and for BRCA2 testing. BRCA-related cancers include these types:  Breast. This occurs in males or females.  Ovarian.  Tubal. This may also be called fallopian tube cancer.  Cancer of the abdominal or pelvic lining (peritoneal cancer).  Prostate.  Pancreatic. Cervical, Uterine, and Ovarian Cancer Your health care provider may recommend that you be screened regularly for cancer of the pelvic organs. These include your ovaries, uterus, and vagina. This screening involves a pelvic exam, which includes checking for microscopic changes to the surface of your cervix (Pap test).  For women ages 21-65, health care providers may recommend a pelvic exam and a Pap test every three years. For women ages 34-65, they may recommend the Pap test and pelvic exam, combined with testing for human papilloma virus (HPV), every five years. Some types of HPV increase your risk of cervical cancer. Testing for  HPV may also be done on women of any age who have unclear Pap test results.  Other health care providers may not recommend any screening for nonpregnant women who are considered low risk for pelvic cancer and have no symptoms. Ask your health care provider if a screening pelvic exam is right for you.  If you have had past treatment for cervical cancer or a condition that could lead to cancer, you need Pap tests and screening for cancer for at least 20 years after your treatment. If Pap tests have been discontinued for you, your risk factors (such as having a new sexual partner) need to be reassessed to determine if you should start having screenings again. Some women have medical problems that increase the chance of getting cervical cancer. In these cases, your health care provider may recommend that you have screening and Pap tests more often.  If you have a family history of uterine cancer or ovarian cancer, talk with your health care provider about genetic screening.  If you have vaginal bleeding after reaching Brianna Wolf, tell your health care provider.  There are currently no reliable tests available to screen for ovarian cancer. Lung Cancer Lung cancer screening is recommended for adults 73-77 years old who are at high risk for lung cancer because of a history of smoking. A yearly low-dose CT scan of the lungs is recommended  if you:  Currently smoke.  Have a history of at least 30 pack-years of smoking and you currently smoke or have quit within the past 15 years. A pack-year is smoking an average of one pack of cigarettes per day for one year. Yearly screening should:  Continue until it has been 15 years since you quit.  Stop if you develop a health problem that would prevent you from having lung cancer treatment. Colorectal Cancer  This type of cancer can be detected and can often be prevented.  Routine colorectal cancer screening usually begins at age 68 and continues through age  79.  If you have risk factors for colon cancer, your health care provider may recommend that you be screened at an earlier age.  If you have a family history of colorectal cancer, talk with your health care provider about genetic screening.  Your health care provider may also recommend using home test kits to check for hidden blood in your stool.  A small camera at the end of a tube can be used to examine your colon directly (sigmoidoscopy or colonoscopy). This is done to check for the earliest forms of colorectal cancer.  Direct examination of the colon should be repeated every 5-10 years until age 7. However, if early forms of precancerous polyps or small growths are found or if you have a family history or genetic risk for colorectal cancer, you may need to be screened more often. Skin Cancer  Check your skin from head to toe regularly.  Monitor any moles. Be sure to tell your health care provider:  About any new moles or changes in moles, especially if there is a change in a mole's shape or color.  If you have a mole that is larger than the size of a pencil eraser.  If any of your family members has a history of skin cancer, especially at a Brianna Wolf age, talk with your health care provider about genetic screening.  Always use sunscreen. Apply sunscreen liberally and repeatedly throughout the day.  Whenever you are outside, protect yourself by wearing long sleeves, pants, a wide-brimmed hat, and sunglasses. WHAT SHOULD I KNOW ABOUT OSTEOPOROSIS? Osteoporosis is a condition in which bone destruction happens more quickly than new bone creation. After Brianna Wolf, you may be at an increased risk for osteoporosis. To help prevent osteoporosis or the bone fractures that can happen because of osteoporosis, the following is recommended:  If you are 72-21 years old, get at least 1,000 mg of calcium and at least 600 mg of vitamin D per day.  If you are older than age 73 but younger than age 43,  get at least 1,200 mg of calcium and at least 600 mg of vitamin D per day.  If you are older than age 60, get at least 1,200 mg of calcium and at least 800 mg of vitamin D per day. Smoking and excessive alcohol intake increase the risk of osteoporosis. Eat foods that are rich in calcium and vitamin D, and do weight-bearing exercises several times each week as directed by your health care provider. WHAT SHOULD I KNOW ABOUT HOW Brianna Wolf AFFECTS Brianna Wolf? Depression may occur at any age, but it is more common as you become older. Common symptoms of depression include:  Low or sad mood.  Changes in sleep patterns.  Changes in appetite or eating patterns.  Feeling an overall lack of motivation or enjoyment of activities that you previously enjoyed.  Frequent crying spells. Talk with your  health care provider if you think that you are experiencing depression. WHAT SHOULD I KNOW ABOUT IMMUNIZATIONS? It is important that you get and maintain your immunizations. These include:  Tetanus, diphtheria, and pertussis (Tdap) booster vaccine.  Influenza every year before the flu season begins.  Pneumonia vaccine.  Shingles vaccine. Your health care provider may also recommend other immunizations.   This information is not intended to replace advice given to you by your health care provider. Make sure you discuss any questions you have with your health care provider.   Document Released: 02/23/2005 Document Revised: 01/22/2014 Document Reviewed: 09/03/2013 Elsevier Interactive Patient Education [employer]. Brianna Wolf is a normal process in which your reproductive ability comes to an end. This process happens gradually over a span of months to years, usually between the ages of 32 and 80. Brianna Wolf is complete when you have missed 12 consecutive menstrual periods. It is important to talk with your health care provider about some of the most common conditions that affect postmenopausal  women, such as heart disease, cancer, and bone loss (osteoporosis). Adopting a healthy lifestyle and getting preventive care can help to promote your health and wellness. Those actions can also lower your chances of developing some of these common conditions. WHAT SHOULD I KNOW ABOUT Brianna Wolf? During Brianna Wolf, you may experience a number of symptoms, such as:  Moderate-to-severe hot flashes.  Night sweats.  Decrease in sex drive.  Mood swings.  Headaches.  Tiredness.  Irritability.  Memory problems.  Insomnia. Choosing to treat or not to treat menopausal changes is an individual decision that you make with your health care provider. WHAT SHOULD I KNOW ABOUT HORMONE REPLACEMENT THERAPY AND SUPPLEMENTS? Hormone therapy products are effective for treating symptoms that are associated with Brianna Wolf, such as hot flashes and night sweats. Hormone replacement carries certain risks, especially as you become older. If you are thinking about using estrogen or estrogen with progestin treatments, discuss the benefits and risks with your health care provider. WHAT SHOULD I KNOW ABOUT HEART DISEASE AND STROKE? Heart disease, heart attack, and stroke become more likely as you age. This may be due, in part, to the hormonal changes that your body experiences during Brianna Wolf. These can affect how your body processes dietary fats, triglycerides, and cholesterol. Heart attack and stroke are both medical emergencies. There are many things that you can do to help prevent heart disease and stroke:  Have your blood pressure checked at least every 1-2 years. High blood pressure causes heart disease and increases the risk of stroke.  If you are 60-67 years old, ask your health care provider if you should take aspirin to prevent a heart attack or a stroke.  Do not use any tobacco products, including cigarettes, chewing tobacco, or electronic cigarettes. If you need help quitting, ask your health care  provider.  It is important to eat a healthy diet and maintain a healthy weight.  Be sure to include plenty of vegetables, fruits, low-fat dairy products, and lean protein.  Avoid eating foods that are high in solid fats, added sugars, or salt (sodium).  Get regular exercise. This is one of the most important things that you can do for your health.  Try to exercise for at least 150 minutes each week. The type of exercise that you do should increase your heart rate and make you sweat. This is known as moderate-intensity exercise.  Try to do strengthening exercises at least twice each week. Do these in addition to the moderate-intensity  exercise.  Know your numbers.Ask your health care provider to check your cholesterol and your blood glucose. Continue to have your blood tested as directed by your health care provider. WHAT SHOULD I KNOW ABOUT CANCER SCREENING? There are several types of cancer. Take the following steps to reduce your risk and to catch any cancer development as early as possible. Breast Cancer  Practice breast self-awareness.  This means understanding how your breasts normally appear and feel.  It also means doing regular breast self-exams. Let your health care provider know about any changes, no matter how small.  If you are 62 or older, have a clinician do a breast exam (clinical breast exam or CBE) every year. Depending on your age, family history, and medical history, it may be recommended that you also have a yearly breast X-ray (mammogram).  If you have a family history of breast cancer, talk with your health care provider about genetic screening.  If you are at high risk for breast cancer, talk with your health care provider about having an MRI and a mammogram every year.  Breast cancer (BRCA) gene test is recommended for women who have family members with BRCA-related cancers. Results of the assessment will determine the need for genetic counseling and BRCA1 and  for BRCA2 testing. BRCA-related cancers include these types:  Breast. This occurs in males or females.  Ovarian.  Tubal. This may also be called fallopian tube cancer.  Cancer of the abdominal or pelvic lining (peritoneal cancer).  Prostate.  Pancreatic. Cervical, Uterine, and Ovarian Cancer Your health care provider may recommend that you be screened regularly for cancer of the pelvic organs. These include your ovaries, uterus, and vagina. This screening involves a pelvic exam, which includes checking for microscopic changes to the surface of your cervix (Pap test).  For women ages 21-65, health care providers may recommend a pelvic exam and a Pap test every three years. For women ages 60-65, they may recommend the Pap test and pelvic exam, combined with testing for human papilloma virus (HPV), every five years. Some types of HPV increase your risk of cervical cancer. Testing for HPV may also be done on women of any age who have unclear Pap test results.  Other health care providers may not recommend any screening for nonpregnant women who are considered low risk for pelvic cancer and have no symptoms. Ask your health care provider if a screening pelvic exam is right for you.  If you have had past treatment for cervical cancer or a condition that could lead to cancer, you need Pap tests and screening for cancer for at least 20 years after your treatment. If Pap tests have been discontinued for you, your risk factors (such as having a new sexual partner) need to be reassessed to determine if you should start having screenings again. Some women have medical problems that increase the chance of getting cervical cancer. In these cases, your health care provider may recommend that you have screening and Pap tests more often.  If you have a family history of uterine cancer or ovarian cancer, talk with your health care provider about genetic screening.  If you have vaginal bleeding after reaching  Brianna Wolf, tell your health care provider.  There are currently no reliable tests available to screen for ovarian cancer. Lung Cancer Lung cancer screening is recommended for adults 20-60 years old who are at high risk for lung cancer because of a history of smoking. A yearly low-dose CT scan of the lungs  is recommended if you:  Currently smoke.  Have a history of at least 30 pack-years of smoking and you currently smoke or have quit within the past 15 years. A pack-year is smoking an average of one pack of cigarettes per day for one year. Yearly screening should:  Continue until it has been 15 years since you quit.  Stop if you develop a health problem that would prevent you from having lung cancer treatment. Colorectal Cancer  This type of cancer can be detected and can often be prevented.  Routine colorectal cancer screening usually begins at age 71 and continues through age 27.  If you have risk factors for colon cancer, your health care provider may recommend that you be screened at an earlier age.  If you have a family history of colorectal cancer, talk with your health care provider about genetic screening.  Your health care provider may also recommend using home test kits to check for hidden blood in your stool.  A small camera at the end of a tube can be used to examine your colon directly (sigmoidoscopy or colonoscopy). This is done to check for the earliest forms of colorectal cancer.  Direct examination of the colon should be repeated every 5-10 years until age 25. However, if early forms of precancerous polyps or small growths are found or if you have a family history or genetic risk for colorectal cancer, you may need to be screened more often. Skin Cancer  Check your skin from head to toe regularly.  Monitor any moles. Be sure to tell your health care provider:  About any new moles or changes in moles, especially if there is a change in a mole's shape or color.  If  you have a mole that is larger than the size of a pencil eraser.  If any of your family members has a history of skin cancer, especially at a Brianna Wolf age, talk with your health care provider about genetic screening.  Always use sunscreen. Apply sunscreen liberally and repeatedly throughout the day.  Whenever you are outside, protect yourself by wearing long sleeves, pants, a wide-brimmed hat, and sunglasses. WHAT SHOULD I KNOW ABOUT OSTEOPOROSIS? Osteoporosis is a condition in which bone destruction happens more quickly than new bone creation. After Brianna Wolf, you may be at an increased risk for osteoporosis. To help prevent osteoporosis or the bone fractures that can happen because of osteoporosis, the following is recommended:  If you are 52-22 years old, get at least 1,000 mg of calcium and at least 600 mg of vitamin D per day.  If you are older than age 5 but younger than age 19, get at least 1,200 mg of calcium and at least 600 mg of vitamin D per day.  If you are older than age 64, get at least 1,200 mg of calcium and at least 800 mg of vitamin D per day. Smoking and excessive alcohol intake increase the risk of osteoporosis. Eat foods that are rich in calcium and vitamin D, and do weight-bearing exercises several times each week as directed by your health care provider. WHAT SHOULD I KNOW ABOUT HOW Brianna Wolf AFFECTS Brianna Wolf? Depression may occur at any age, but it is more common as you become older. Common symptoms of depression include:  Low or sad mood.  Changes in sleep patterns.  Changes in appetite or eating patterns.  Feeling an overall lack of motivation or enjoyment of activities that you previously enjoyed.  Frequent crying spells. Talk with  your health care provider if you think that you are experiencing depression. WHAT SHOULD I KNOW ABOUT IMMUNIZATIONS? It is important that you get and maintain your immunizations. These include:  Tetanus, diphtheria, and  pertussis (Tdap) booster vaccine.  Influenza every year before the flu season begins.  Pneumonia vaccine.  Shingles vaccine. Your health care provider may also recommend other immunizations.   This information is not intended to replace advice given to you by your health care provider. Make sure you discuss any questions you have with your health care provider.   Document Released: 02/23/2005 Document Revised: 01/22/2014 Document Reviewed: 09/03/2013 Elsevier Interactive Patient Education [employer].

## 2015-04-28 LAB — URINALYSIS W MICROSCOPIC + REFLEX CULTURE
Bilirubin Urine: NEGATIVE
Casts: NONE SEEN [LPF]
Glucose, UA: NEGATIVE
Ketones, ur: NEGATIVE
Leukocytes, UA: NEGATIVE
Nitrite: NEGATIVE
Protein, ur: NEGATIVE
Specific Gravity, Urine: 1.012 (ref 1.001–1.035)
Yeast: NONE SEEN [HPF]
pH: 7.5 (ref 5.0–8.0)

## 2015-04-29 LAB — URINE CULTURE: Colony Count: >=100000

## 2015-05-02 ENCOUNTER — Other Ambulatory Visit: Payer: Self-pay | Admitting: Women's Health

## 2015-05-02 MED ORDER — SULFAMETHOXAZOLE-TRIMETHOPRIM 800-160 MG PO TABS: 1.0000 | ORAL_TABLET | Freq: Two times a day (BID) | ORAL | Status: DC

## 2015-05-04 DIAGNOSIS — L501 Idiopathic urticaria: Secondary | ICD-10-CM | POA: Insufficient documentation

## 2015-05-04 DIAGNOSIS — Z5181 Encounter for therapeutic drug level monitoring: Secondary | ICD-10-CM | POA: Diagnosis not present

## 2015-05-06 ENCOUNTER — Encounter: Payer: Self-pay | Admitting: Women's Health

## 2015-05-18 DIAGNOSIS — R202 Paresthesia of skin: Secondary | ICD-10-CM | POA: Diagnosis not present

## 2015-05-18 DIAGNOSIS — L508 Other urticaria: Secondary | ICD-10-CM | POA: Diagnosis not present

## 2015-07-10 DIAGNOSIS — R609 Edema, unspecified: Secondary | ICD-10-CM | POA: Diagnosis not present

## 2015-07-10 DIAGNOSIS — R030 Elevated blood-pressure reading, without diagnosis of hypertension: Secondary | ICD-10-CM | POA: Diagnosis not present

## 2015-08-03 DIAGNOSIS — Z79899 Other long term (current) drug therapy: Secondary | ICD-10-CM | POA: Diagnosis not present

## 2015-08-03 DIAGNOSIS — L501 Idiopathic urticaria: Secondary | ICD-10-CM | POA: Diagnosis not present

## 2015-08-03 DIAGNOSIS — Z5181 Encounter for therapeutic drug level monitoring: Secondary | ICD-10-CM | POA: Diagnosis not present

## 2015-08-25 ENCOUNTER — Emergency Department (HOSPITAL_COMMUNITY)
Admission: EM | Admit: 2015-08-25 | Discharge: 2015-08-26 | Disposition: A | Discharge status: Home / Self Care | Payer: BLUE CROSS/BLUE SHIELD | Attending: Emergency Medicine | Admitting: Emergency Medicine

## 2015-08-25 ENCOUNTER — Encounter (HOSPITAL_COMMUNITY): Payer: Self-pay | Admitting: Emergency Medicine

## 2015-08-25 DIAGNOSIS — Z79899 Other long term (current) drug therapy: Secondary | ICD-10-CM | POA: Insufficient documentation

## 2015-08-25 DIAGNOSIS — L509 Urticaria, unspecified: Secondary | ICD-10-CM | POA: Diagnosis not present

## 2015-08-25 DIAGNOSIS — T7840XA Allergy, unspecified, initial encounter: Secondary | ICD-10-CM | POA: Diagnosis not present

## 2015-08-25 DIAGNOSIS — R030 Elevated blood-pressure reading, without diagnosis of hypertension: Secondary | ICD-10-CM | POA: Diagnosis not present

## 2015-08-25 DIAGNOSIS — L501 Idiopathic urticaria: Secondary | ICD-10-CM | POA: Diagnosis not present

## 2015-08-25 DIAGNOSIS — I10 Essential (primary) hypertension: Secondary | ICD-10-CM | POA: Diagnosis not present

## 2015-08-25 DIAGNOSIS — T783XXA Angioneurotic edema, initial encounter: Secondary | ICD-10-CM | POA: Diagnosis not present

## 2015-08-25 NOTE — ED Notes (Signed)
Bed: RESA Expected date:  Expected time:  Means of arrival:  Comments: EMS 50 yo female from Urgent Care-auto-immune allergic reaction/epi-throat tight/hives

## 2015-08-25 NOTE — ED Triage Notes (Signed)
Pt coming from urgent care, given 0.3 of epi and an steroid. 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 Pt c/o tightness in throat. Denies SOB
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
## 2015-08-26 MED ORDER — PREDNISONE 10 MG PO TABS: 20.0000 mg | ORAL_TABLET | Freq: Two times a day (BID) | ORAL | 0 refills | Status: DC

## 2015-08-26 NOTE — Discharge Instructions (Signed)
Use the prednisone prescription as directed.  Continue to take your medications, for urticaria.  Return here, if needed, for problems.

## 2015-08-29 ENCOUNTER — Other Ambulatory Visit: Payer: Self-pay

## 2015-08-29 DIAGNOSIS — F329 Major depressive disorder, single episode, unspecified: Secondary | ICD-10-CM

## 2015-08-29 DIAGNOSIS — F32A Depression, unspecified: Secondary | ICD-10-CM

## 2015-08-29 MED ORDER — BUPROPION HCL ER (XL) 300 MG PO TB24: 300.0000 mg | ORAL_TABLET | Freq: Every day | ORAL | 2 refills | Status: DC

## 2015-09-05 ENCOUNTER — Other Ambulatory Visit: Payer: Self-pay | Admitting: *Deleted

## 2015-09-05 DIAGNOSIS — F32A Depression, unspecified: Secondary | ICD-10-CM

## 2015-09-05 DIAGNOSIS — F329 Major depressive disorder, single episode, unspecified: Secondary | ICD-10-CM

## 2015-09-05 MED ORDER — BUPROPION HCL ER (XL) 300 MG PO TB24
300.0000 mg | ORAL_TABLET | Freq: Every day | ORAL | 1 refills | Status: DC
Start: 2015-09-05 — End: 2016-05-21

## 2015-12-21 DIAGNOSIS — L501 Idiopathic urticaria: Secondary | ICD-10-CM | POA: Diagnosis not present

## 2015-12-21 DIAGNOSIS — Z5181 Encounter for therapeutic drug level monitoring: Secondary | ICD-10-CM | POA: Diagnosis not present

## 2016-05-02 DIAGNOSIS — Z5181 Encounter for therapeutic drug level monitoring: Secondary | ICD-10-CM | POA: Diagnosis not present

## 2016-05-02 DIAGNOSIS — L501 Idiopathic urticaria: Secondary | ICD-10-CM | POA: Diagnosis not present

## 2016-05-21 ENCOUNTER — Encounter: Payer: Self-pay | Admitting: Women's Health

## 2016-05-21 ENCOUNTER — Encounter: Payer: Self-pay | Admitting: Gynecology

## 2016-05-21 ENCOUNTER — Ambulatory Visit (INDEPENDENT_AMBULATORY_CARE_PROVIDER_SITE_OTHER): Payer: BLUE CROSS/BLUE SHIELD | Admitting: Women's Health

## 2016-05-21 VITALS — BP 132/80 | Ht 63.0 in | Wt 135.0 lb

## 2016-05-21 DIAGNOSIS — N952 Postmenopausal atrophic vaginitis: Secondary | ICD-10-CM

## 2016-05-21 DIAGNOSIS — Z01419 Encounter for gynecological examination (general) (routine) without abnormal findings: Secondary | ICD-10-CM

## 2016-05-21 DIAGNOSIS — F3289 Other specified depressive episodes: Secondary | ICD-10-CM

## 2016-05-21 DIAGNOSIS — Z1231 Encounter for screening mammogram for malignant neoplasm of breast: Secondary | ICD-10-CM | POA: Diagnosis not present

## 2016-05-21 MED ORDER — BUPROPION HCL ER (XL) 300 MG PO TB24: 300.0000 mg | ORAL_TABLET | Freq: Every day | ORAL | 4 refills | Status: DC

## 2016-05-21 MED ORDER — ESTRADIOL 2 MG VA RING: 2.0000 mg | VAGINAL_RING | VAGINAL | 4 refills | Status: DC

## 2016-05-21 NOTE — Patient Instructions (Signed)
Health Maintenance for Postmenopausal Women Menopause is a normal process in which your reproductive ability comes to an end. This process happens gradually over a span of months to years, usually between the ages of 33 and 38. Menopause is complete when you have missed 12 consecutive menstrual periods. It is important to talk with your health care provider about some of the most common conditions that affect postmenopausal women, such as heart disease, cancer, and bone loss (osteoporosis). Adopting a healthy lifestyle and getting preventive care can help to promote your health and wellness. Those actions can also lower your chances of developing some of these common conditions. What should I know about menopause? During menopause, you may experience a number of symptoms, such as:  Moderate-to-severe hot flashes.  Night sweats.  Decrease in sex drive.  Mood swings.  Headaches.  Tiredness.  Irritability.  Memory problems.  Insomnia. Choosing to treat or not to treat menopausal changes is an individual decision that you make with your health care provider. What should I know about hormone replacement therapy and supplements? Hormone therapy products are effective for treating symptoms that are associated with menopause, such as hot flashes and night sweats. Hormone replacement carries certain risks, especially as you become older. If you are thinking about using estrogen or estrogen with progestin treatments, discuss the benefits and risks with your health care provider. What should I know about heart disease and stroke? Heart disease, heart attack, and stroke become more likely as you age. This may be due, in part, to the hormonal changes that your body experiences during menopause. These can affect how your body processes dietary fats, triglycerides, and cholesterol. Heart attack and stroke are both medical emergencies. There are many things that you can do to help prevent heart disease  and stroke:  Have your blood pressure checked at least every 1-2 years. High blood pressure causes heart disease and increases the risk of stroke.  If you are 48-61 years old, ask your health care provider if you should take aspirin to prevent a heart attack or a stroke.  Do not use any tobacco products, including cigarettes, chewing tobacco, or electronic cigarettes. If you need help quitting, ask your health care provider.  It is important to eat a healthy diet and maintain a healthy weight.  Be sure to include plenty of vegetables, fruits, low-fat dairy products, and lean protein.  Avoid eating foods that are high in solid fats, added sugars, or salt (sodium).  Get regular exercise. This is one of the most important things that you can do for your health.  Try to exercise for at least 150 minutes each week. The type of exercise that you do should increase your heart rate and make you sweat. This is known as moderate-intensity exercise.  Try to do strengthening exercises at least twice each week. Do these in addition to the moderate-intensity exercise.  Know your numbers.Ask your health care provider to check your cholesterol and your blood glucose. Continue to have your blood tested as directed by your health care provider. What should I know about cancer screening? There are several types of cancer. Take the following steps to reduce your risk and to catch any cancer development as early as possible. Breast Cancer  Practice breast self-awareness.  This means understanding how your breasts normally appear and feel.  It also means doing regular breast self-exams. Let your health care provider know about any changes, no matter how small.  If you are 40 or older,  have a clinician do a breast exam (clinical breast exam or CBE) every year. Depending on your age, family history, and medical history, it may be recommended that you also have a yearly breast X-ray (mammogram).  If you  have a family history of breast cancer, talk with your health care provider about genetic screening.  If you are at high risk for breast cancer, talk with your health care provider about having an MRI and a mammogram every year.  Breast cancer (BRCA) gene test is recommended for women who have family members with BRCA-related cancers. Results of the assessment will determine the need for genetic counseling and BRCA1 and for BRCA2 testing. BRCA-related cancers include these types:  Breast. This occurs in males or females.  Ovarian.  Tubal. This may also be called fallopian tube cancer.  Cancer of the abdominal or pelvic lining (peritoneal cancer).  Prostate.  Pancreatic. Cervical, Uterine, and Ovarian Cancer  Your health care provider may recommend that you be screened regularly for cancer of the pelvic organs. These include your ovaries, uterus, and vagina. This screening involves a pelvic exam, which includes checking for microscopic changes to the surface of your cervix (Pap test).  For women ages 21-65, health care providers may recommend a pelvic exam and a Pap test every three years. For women ages 23-65, they may recommend the Pap test and pelvic exam, combined with testing for human papilloma virus (HPV), every five years. Some types of HPV increase your risk of cervical cancer. Testing for HPV may also be done on women of any age who have unclear Pap test results.  Other health care providers may not recommend any screening for nonpregnant women who are considered low risk for pelvic cancer and have no symptoms. Ask your health care provider if a screening pelvic exam is right for you.  If you have had past treatment for cervical cancer or a condition that could lead to cancer, you need Pap tests and screening for cancer for at least 20 years after your treatment. If Pap tests have been discontinued for you, your risk factors (such as having a new sexual partner) need to be reassessed  to determine if you should start having screenings again. Some women have medical problems that increase the chance of getting cervical cancer. In these cases, your health care provider may recommend that you have screening and Pap tests more often.  If you have a family history of uterine cancer or ovarian cancer, talk with your health care provider about genetic screening.  If you have vaginal bleeding after reaching menopause, tell your health care provider.  There are currently no reliable tests available to screen for ovarian cancer. Lung Cancer  Lung cancer screening is recommended for adults 99-83 years old who are at high risk for lung cancer because of a history of smoking. A yearly low-dose CT scan of the lungs is recommended if you:  Currently smoke.  Have a history of at least 30 pack-years of smoking and you currently smoke or have quit within the past 15 years. A pack-year is smoking an average of one pack of cigarettes per day for one year. Yearly screening should:  Continue until it has been 15 years since you quit.  Stop if you develop a health problem that would prevent you from having lung cancer treatment. Colorectal Cancer  This type of cancer can be detected and can often be prevented.  Routine colorectal cancer screening usually begins at age 72 and continues  through age 75.  If you have risk factors for colon cancer, your health care provider may recommend that you be screened at an earlier age.  If you have a family history of colorectal cancer, talk with your health care provider about genetic screening.  Your health care provider may also recommend using home test kits to check for hidden blood in your stool.  A small camera at the end of a tube can be used to examine your colon directly (sigmoidoscopy or colonoscopy). This is done to check for the earliest forms of colorectal cancer.  Direct examination of the colon should be repeated every 5-10 years until  age 75. However, if early forms of precancerous polyps or small growths are found or if you have a family history or genetic risk for colorectal cancer, you may need to be screened more often. Skin Cancer  Check your skin from head to toe regularly.  Monitor any moles. Be sure to tell your health care provider:  About any new moles or changes in moles, especially if there is a change in a mole's shape or color.  If you have a mole that is larger than the size of a pencil eraser.  If any of your family members has a history of skin cancer, especially at a Patina Spanier age, talk with your health care provider about genetic screening.  Always use sunscreen. Apply sunscreen liberally and repeatedly throughout the day.  Whenever you are outside, protect yourself by wearing long sleeves, pants, a wide-brimmed hat, and sunglasses. What should I know about osteoporosis? Osteoporosis is a condition in which bone destruction happens more quickly than new bone creation. After menopause, you may be at an increased risk for osteoporosis. To help prevent osteoporosis or the bone fractures that can happen because of osteoporosis, the following is recommended:  If you are 19-50 years old, get at least 1,000 mg of calcium and at least 600 mg of vitamin D per day.  If you are older than age 50 but younger than age 70, get at least 1,200 mg of calcium and at least 600 mg of vitamin D per day.  If you are older than age 70, get at least 1,200 mg of calcium and at least 800 mg of vitamin D per day. Smoking and excessive alcohol intake increase the risk of osteoporosis. Eat foods that are rich in calcium and vitamin D, and do weight-bearing exercises several times each week as directed by your health care provider. What should I know about how menopause affects my mental health? Depression may occur at any age, but it is more common as you become older. Common symptoms of depression include:  Low or sad  mood.  Changes in sleep patterns.  Changes in appetite or eating patterns.  Feeling an overall lack of motivation or enjoyment of activities that you previously enjoyed.  Frequent crying spells. Talk with your health care provider if you think that you are experiencing depression. What should I know about immunizations? It is important that you get and maintain your immunizations. These include:  Tetanus, diphtheria, and pertussis (Tdap) booster vaccine.  Influenza every year before the flu season begins.  Pneumonia vaccine.  Shingles vaccine. Your health care provider may also recommend other immunizations. This information is not intended to replace advice given to you by your health care provider. Make sure you discuss any questions you have with your health care provider. Document Released: 02/23/2005 Document Revised: 07/22/2015 Document Reviewed: 10/05/2014 Elsevier Interactive Patient   Education  2017 [employer]  

## 2016-05-21 NOTE — Progress Notes (Signed)
Brianna Wolf 1965-01-30 [id number]    History:    Presents for annual exam.  2010 LAVH for fibroids with occasional menopausal symptoms. Normal Pap and mammogram history. 2017 benign colon polyp 5 year follow-up. Depression stable on Wellbutrin. Vitiligo. History of hives does have follow-up scheduled. Brought a copy of labs which are all normal. Husband history of prostate cancer able to have intercourse. Vegetarian.  Past medical history, past surgical history, family history and social history were all reviewed and documented in the EPIC chart. Works at [employer]. 3 children all doing well. Parents hypertension.  ROS:  A ROS was performed and pertinent positives and negatives are included.  Exam:  Vitals:   05/21/16 1147  BP: 132/80  Weight: 135 lb (61.2 kg)  Height: 5\' 3"  (1.6 m)   Body mass index is 23.91 kg/m.   General appearance:  Normal Thyroid:  Symmetrical, normal in size, without palpable masses or nodularity. Respiratory  Auscultation:  Clear without wheezing or rhonchi Cardiovascular  Auscultation:  Regular rate, without rubs, murmurs or gallops  Edema/varicosities:  Not grossly evident Abdominal  Soft,nontender, without masses, guarding or rebound.  Liver/spleen:  No organomegaly noted  Hernia:  None appreciated  Skin  Inspection:  Grossly normal   Breasts: Examined lying and sitting.     Right: Without masses, retractions, discharge or axillary adenopathy.     Left: Without masses, retractions, discharge or axillary adenopathy. Gentitourinary   Inguinal/mons:  Normal without inguinal adenopathy  External genitalia:  Normal  BUS/Urethra/Skene's glands:  Normal  Vagina:  Atrophic  Cervix:  Absent  Uterus:  Absent Adnexa/parametria:     Rt: Without masses or tenderness.   Lt: Without masses or tenderness.  Anus and perineum: Normal  Digital rectal exam: Normal sphincter tone without palpated masses or tenderness  Assessment/Plan:  51 y.o.  MWF G3 P3  for annual exam with complaint of vaginal dryness not relieved with lubricants.  2010 LAVH for fibroids/no HRT/dyspareunia Vitiligo Idiopathic hives-follow-up with allergist Depression stable on Wellbutrin Labs-allergist  Plan: Vaginal dryness reviewed, will try Estring 2 mg per vagina for 3 months prescription, proper use given and reviewed, reviewed minimal systemic absorption. SBE's, continue annual screening mammogram, calcium rich diet, vitamin D 2000 daily encouraged. Home safety, fall prevention and importance of weightbearing exercise reviewed. Wellbutrin 300 mg XL prescription, proper use given and reviewed, counseling as needed.  [place], 1:08 PM 05/21/2016

## 2016-05-23 ENCOUNTER — Telehealth: Payer: Self-pay | Admitting: *Deleted

## 2016-05-23 DIAGNOSIS — J101 Influenza due to other identified influenza virus with other respiratory manifestations: Secondary | ICD-10-CM | POA: Diagnosis not present

## 2016-05-23 DIAGNOSIS — R509 Fever, unspecified: Secondary | ICD-10-CM | POA: Diagnosis not present

## 2016-05-23 DIAGNOSIS — R51 Headache: Secondary | ICD-10-CM | POA: Diagnosis not present

## 2016-05-23 DIAGNOSIS — R5383 Other fatigue: Secondary | ICD-10-CM | POA: Diagnosis not present

## 2016-05-23 NOTE — Telephone Encounter (Signed)
Pt was prescribed estring 2 mg on 05/21/16 drug is not covered by plain, preferred alternative is Yuvafem,estradiol, premarin are any of these options for patient? Please advise

## 2016-05-23 NOTE — Telephone Encounter (Signed)
Pt said she is going to try to use the coupon that nancy gave her in office, if Rx still expensive will call and the below will be sent.

## 2016-05-23 NOTE — Telephone Encounter (Signed)
Estrace vaginal cream apply small amount 2-3 times weekly externally, there is a coupon for this also. We could mail or she could look at up online. Dispense 1 tube 12 refills.

## 2016-05-24 ENCOUNTER — Telehealth: Payer: Self-pay

## 2016-05-24 MED ORDER — ESTRADIOL 10 MCG VA TABS: ORAL_TABLET | VAGINAL | 11 refills | Status: DC

## 2016-05-24 NOTE — Telephone Encounter (Signed)
Is that a generic vagifem?  If so ok, vagifem 1 applicator each night for 2 week than twice weekly.

## 2016-05-24 NOTE — Telephone Encounter (Signed)
Just realized Brianna Wolf has already sent this message from pharmacy to [place]. I will close this encounter.

## 2016-05-24 NOTE — Telephone Encounter (Signed)
Received a note from pharmacy regarding Brianna Wolf.  "Drug not covered by patient plan. The preferred alternative is Yuvafem, Estradiol, Premarin. Please send change in medication along with strength, directions, quantity and refills."

## 2016-05-24 NOTE — Telephone Encounter (Signed)
Pt asked if she could try Yuvafem tablets? Pt would prefer not to use a cream, states it will be messy. Please advise

## 2016-05-24 NOTE — Telephone Encounter (Signed)
Pt informed, Rx sent. 

## 2016-07-09 DIAGNOSIS — L03011 Cellulitis of right finger: Secondary | ICD-10-CM | POA: Diagnosis not present

## 2016-07-31 ENCOUNTER — Telehealth: Payer: Self-pay | Admitting: *Deleted

## 2016-07-31 NOTE — Telephone Encounter (Signed)
Pt called has been taking Yuvafem 10 mcg x 3 months and has not seen any improvement with Rx, still has vaginal dryness, pt asked if another Rx could be sent? Please advise

## 2016-08-01 ENCOUNTER — Other Ambulatory Visit: Payer: Self-pay | Admitting: Women's Health

## 2016-08-01 MED ORDER — ESTRADIOL 2 MG VA RING: 2.0000 mg | VAGINAL_RING | VAGINAL | 3 refills | Status: DC

## 2016-08-01 NOTE — Telephone Encounter (Signed)
Telephone call, options reviewed would like to try Estring, Rx E scribed and coupon card mailed.

## 2016-08-07 ENCOUNTER — Telehealth: Payer: Self-pay

## 2016-08-07 NOTE — Telephone Encounter (Signed)
On 07/31/16 you prescribed Estring 2 mg. Vag ring. After her trying Yuvafem x 3 mos.  Pharmacy sent a note stating that "This drug is not covered by patient's plan.  The preferred alternatives are Yuvafem, Estradiol, Premarin." Please send new Rx.

## 2016-08-08 ENCOUNTER — Other Ambulatory Visit: Payer: Self-pay | Admitting: Women's Health

## 2016-08-08 MED ORDER — ESTROGENS, CONJUGATED 0.625 MG/GM VA CREA: TOPICAL_CREAM | VAGINAL | 12 refills | Status: DC

## 2016-08-08 NOTE — Telephone Encounter (Signed)
Telephone call, options reviewed would like to try vaginal cream will try Premarin vaginal cream we'll send coupon card also. We used 2-3 times weekly vaginally and externally as needed. Will call if no relief of symptoms.

## 2016-08-29 ENCOUNTER — Encounter: Payer: Self-pay | Admitting: Women's Health

## 2016-08-29 ENCOUNTER — Ambulatory Visit (INDEPENDENT_AMBULATORY_CARE_PROVIDER_SITE_OTHER): Payer: BLUE CROSS/BLUE SHIELD | Admitting: Women's Health

## 2016-08-29 ENCOUNTER — Other Ambulatory Visit: Payer: Self-pay | Admitting: Women's Health

## 2016-08-29 VITALS — BP 110/78 | Ht 63.0 in | Wt 135.0 lb

## 2016-08-29 DIAGNOSIS — N3001 Acute cystitis with hematuria: Secondary | ICD-10-CM

## 2016-08-29 DIAGNOSIS — R300 Dysuria: Secondary | ICD-10-CM | POA: Diagnosis not present

## 2016-08-29 DIAGNOSIS — Z23 Encounter for immunization: Secondary | ICD-10-CM

## 2016-08-29 LAB — URINALYSIS W MICROSCOPIC + REFLEX CULTURE
Bilirubin Urine: NEGATIVE
Casts: NONE SEEN [LPF]
Crystals: NONE SEEN [HPF]
Glucose, UA: NEGATIVE
Nitrite: NEGATIVE
Specific Gravity, Urine: 1.02 (ref 1.001–1.035)
Yeast: NONE SEEN [HPF]
pH: 6.5 (ref 5.0–8.0)

## 2016-08-29 MED ORDER — SULFAMETHOXAZOLE-TRIMETHOPRIM 800-160 MG PO TABS: 1.0000 | ORAL_TABLET | Freq: Two times a day (BID) | ORAL | 0 refills | Status: DC

## 2016-08-29 NOTE — Progress Notes (Signed)
Presents with complaint of increased urinary frequency, urgency, burning sensation since this a.m. Denies pain at end of stream, vaginal discharge, abdominal pain or fever. States had a fever last week of 101 with a headache but no urinary symptoms. Headache has resolved. Using Premarin cream with minimal relief of symptoms of dryness. LAVH fibroids.  Exam: Appears well. UA: +1 blood, trace protein, trace leukocytes, 20-40 WBCs, 10-20 rbc's, many bacteria  UTI  Plan: Septra twice daily for 3 days, UTI prevention discussed. Instructed to call if no relief. Other options of vaginal dryness reviewed, patch, oral estrogen, declines will wait to see if any improvement.

## 2016-08-29 NOTE — Patient Instructions (Signed)

## 2016-08-29 NOTE — Addendum Note (Signed)
Addended by: Burnett Kanaris on: 08/29/2016 02:54 PM   Modules accepted: Orders

## 2016-08-30 ENCOUNTER — Telehealth: Payer: Self-pay | Admitting: *Deleted

## 2016-08-30 NOTE — Telephone Encounter (Signed)
Pt informed

## 2016-08-30 NOTE — Telephone Encounter (Signed)
You are back up MD) pt was treated on 08/29/16 for UTI septra ds twice daily x 3 days, states this am she noticed lower back discomfort which is new, drinking plenty of fluids. Unsure if this side effect or a new issue. Please advise

## 2016-08-30 NOTE — Telephone Encounter (Signed)
Since she is one day into the antibiotic I would just complete the antibiotic course, push the fluids and use Motrin for the low back pain. If symptoms would persist or develop other symptoms such as fevers or worsening urinary symptoms then office visit to evaluate

## 2016-08-31 LAB — URINE CULTURE

## 2016-09-03 DIAGNOSIS — Z5181 Encounter for therapeutic drug level monitoring: Secondary | ICD-10-CM | POA: Diagnosis not present

## 2016-09-03 DIAGNOSIS — L501 Idiopathic urticaria: Secondary | ICD-10-CM | POA: Diagnosis not present

## 2016-09-03 DIAGNOSIS — Z862 Personal history of diseases of the blood and blood-forming organs and certain disorders involving the immune mechanism: Secondary | ICD-10-CM | POA: Diagnosis not present

## 2016-11-13 ENCOUNTER — Encounter: Payer: Self-pay | Admitting: Women's Health

## 2016-11-14 ENCOUNTER — Telehealth: Payer: Self-pay | Admitting: *Deleted

## 2016-11-14 NOTE — Telephone Encounter (Signed)
Notes faxed to [facility] they will call the patient to schedule, and fax me back with time and date.

## 2016-11-14 NOTE — Telephone Encounter (Signed)
-----   Message from [name], NP sent at 11/13/2016  2:09 PM EDT ----- Needs a referral/appointment to Dr. McDiarmid to evaluate urinary leakage/incontinence. Postmenopausal no relief with Premarin cream.

## 2016-11-27 DIAGNOSIS — N393 Stress incontinence (female) (male): Secondary | ICD-10-CM | POA: Diagnosis not present

## 2016-11-27 NOTE — Telephone Encounter (Signed)
Pt scheduled on 11/27/16 @ 7:45am with Dr.McKenzie pt aware.

## 2016-11-29 DIAGNOSIS — N3946 Mixed incontinence: Secondary | ICD-10-CM | POA: Diagnosis not present

## 2016-12-03 ENCOUNTER — Telehealth: Payer: Self-pay | Admitting: *Deleted

## 2016-12-03 NOTE — Telephone Encounter (Signed)
Patient husband Shanon Brow called requesting to speak with you about patient. Has DPR access, wants to discuss vaginal cream ( takes premarin) I did explain to husband you are seeing patients. [phone]

## 2016-12-04 NOTE — Telephone Encounter (Signed)
Telephone call, questions answered, has tried Vagifem, Estring and vaginal estrogen cream with no relief of dryness and dyspareunia.  Will also call pt to discuss.
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
## 2016-12-19 DIAGNOSIS — N393 Stress incontinence (female) (male): Secondary | ICD-10-CM | POA: Diagnosis not present

## 2016-12-28 ENCOUNTER — Ambulatory Visit (INDEPENDENT_AMBULATORY_CARE_PROVIDER_SITE_OTHER): Payer: BLUE CROSS/BLUE SHIELD | Admitting: Licensed Clinical Social Worker

## 2016-12-28 DIAGNOSIS — F324 Major depressive disorder, single episode, in partial remission: Secondary | ICD-10-CM

## 2017-01-04 DIAGNOSIS — N393 Stress incontinence (female) (male): Secondary | ICD-10-CM | POA: Diagnosis not present

## 2017-01-21 ENCOUNTER — Ambulatory Visit (INDEPENDENT_AMBULATORY_CARE_PROVIDER_SITE_OTHER): Payer: BLUE CROSS/BLUE SHIELD | Admitting: Licensed Clinical Social Worker

## 2017-01-21 DIAGNOSIS — F324 Major depressive disorder, single episode, in partial remission: Secondary | ICD-10-CM | POA: Diagnosis not present

## 2017-02-18 ENCOUNTER — Ambulatory Visit (INDEPENDENT_AMBULATORY_CARE_PROVIDER_SITE_OTHER): Payer: BLUE CROSS/BLUE SHIELD | Admitting: Licensed Clinical Social Worker

## 2017-02-18 DIAGNOSIS — F324 Major depressive disorder, single episode, in partial remission: Secondary | ICD-10-CM | POA: Diagnosis not present

## 2017-02-25 ENCOUNTER — Ambulatory Visit (INDEPENDENT_AMBULATORY_CARE_PROVIDER_SITE_OTHER): Payer: BLUE CROSS/BLUE SHIELD | Admitting: Licensed Clinical Social Worker

## 2017-02-25 DIAGNOSIS — F324 Major depressive disorder, single episode, in partial remission: Secondary | ICD-10-CM | POA: Diagnosis not present

## 2017-03-04 ENCOUNTER — Ambulatory Visit (INDEPENDENT_AMBULATORY_CARE_PROVIDER_SITE_OTHER): Payer: BLUE CROSS/BLUE SHIELD | Admitting: Licensed Clinical Social Worker

## 2017-03-04 DIAGNOSIS — F324 Major depressive disorder, single episode, in partial remission: Secondary | ICD-10-CM

## 2017-03-11 ENCOUNTER — Ambulatory Visit (INDEPENDENT_AMBULATORY_CARE_PROVIDER_SITE_OTHER): Payer: BLUE CROSS/BLUE SHIELD | Admitting: Licensed Clinical Social Worker

## 2017-03-11 DIAGNOSIS — F324 Major depressive disorder, single episode, in partial remission: Secondary | ICD-10-CM | POA: Diagnosis not present

## 2017-03-18 ENCOUNTER — Ambulatory Visit (INDEPENDENT_AMBULATORY_CARE_PROVIDER_SITE_OTHER): Payer: BLUE CROSS/BLUE SHIELD | Admitting: Licensed Clinical Social Worker

## 2017-03-18 DIAGNOSIS — F324 Major depressive disorder, single episode, in partial remission: Secondary | ICD-10-CM

## 2017-03-25 ENCOUNTER — Ambulatory Visit: Payer: Self-pay | Admitting: Licensed Clinical Social Worker

## 2017-04-01 ENCOUNTER — Ambulatory Visit (INDEPENDENT_AMBULATORY_CARE_PROVIDER_SITE_OTHER): Payer: BLUE CROSS/BLUE SHIELD | Admitting: Licensed Clinical Social Worker

## 2017-04-01 DIAGNOSIS — F324 Major depressive disorder, single episode, in partial remission: Secondary | ICD-10-CM | POA: Diagnosis not present

## 2017-04-08 ENCOUNTER — Encounter: Payer: Self-pay | Admitting: Women's Health

## 2017-04-08 ENCOUNTER — Ambulatory Visit (INDEPENDENT_AMBULATORY_CARE_PROVIDER_SITE_OTHER): Payer: BLUE CROSS/BLUE SHIELD | Admitting: Licensed Clinical Social Worker

## 2017-04-08 DIAGNOSIS — F324 Major depressive disorder, single episode, in partial remission: Secondary | ICD-10-CM | POA: Diagnosis not present

## 2017-04-10 ENCOUNTER — Encounter: Payer: Self-pay | Admitting: Women's Health

## 2017-04-15 ENCOUNTER — Ambulatory Visit: Payer: Self-pay | Admitting: Licensed Clinical Social Worker

## 2017-04-22 ENCOUNTER — Ambulatory Visit (INDEPENDENT_AMBULATORY_CARE_PROVIDER_SITE_OTHER): Payer: BLUE CROSS/BLUE SHIELD | Admitting: Licensed Clinical Social Worker

## 2017-04-22 DIAGNOSIS — F324 Major depressive disorder, single episode, in partial remission: Secondary | ICD-10-CM | POA: Diagnosis not present

## 2017-05-06 ENCOUNTER — Ambulatory Visit (INDEPENDENT_AMBULATORY_CARE_PROVIDER_SITE_OTHER): Payer: BLUE CROSS/BLUE SHIELD | Admitting: Licensed Clinical Social Worker

## 2017-05-06 DIAGNOSIS — F324 Major depressive disorder, single episode, in partial remission: Secondary | ICD-10-CM | POA: Diagnosis not present

## 2017-05-20 ENCOUNTER — Ambulatory Visit: Payer: Self-pay | Admitting: Licensed Clinical Social Worker

## 2017-05-30 DIAGNOSIS — Z1231 Encounter for screening mammogram for malignant neoplasm of breast: Secondary | ICD-10-CM | POA: Diagnosis not present

## 2017-06-08 ENCOUNTER — Other Ambulatory Visit: Payer: Self-pay | Admitting: Women's Health

## 2017-06-08 DIAGNOSIS — F3289 Other specified depressive episodes: Secondary | ICD-10-CM

## 2017-06-11 NOTE — Telephone Encounter (Signed)
OK for refill but she needs an annual exam

## 2017-06-17 ENCOUNTER — Encounter: Payer: BLUE CROSS/BLUE SHIELD | Admitting: Women's Health

## 2017-06-17 ENCOUNTER — Ambulatory Visit (INDEPENDENT_AMBULATORY_CARE_PROVIDER_SITE_OTHER): Payer: BLUE CROSS/BLUE SHIELD | Admitting: Licensed Clinical Social Worker

## 2017-06-17 DIAGNOSIS — F324 Major depressive disorder, single episode, in partial remission: Secondary | ICD-10-CM

## 2017-07-01 ENCOUNTER — Ambulatory Visit: Payer: BLUE CROSS/BLUE SHIELD | Admitting: Licensed Clinical Social Worker

## 2017-08-06 ENCOUNTER — Encounter: Payer: BLUE CROSS/BLUE SHIELD | Admitting: Women's Health

## 2017-09-10 ENCOUNTER — Other Ambulatory Visit: Payer: Self-pay | Admitting: Women's Health

## 2017-09-10 DIAGNOSIS — F3289 Other specified depressive episodes: Secondary | ICD-10-CM

## 2017-09-10 NOTE — Telephone Encounter (Signed)
Yes that sounds good #30 and have her keep appointment.

## 2017-09-10 NOTE — Telephone Encounter (Signed)
Last CE was 05/21/2016. (You refilled 90 days supply on 06/11/17 with note she needed CE.)  Patient cancelled appt 06/17/17, 08/06/17 and now scheduled for 10/02/17.  Could just okay for #30 to take her to visit instead of #90?

## 2017-10-02 ENCOUNTER — Encounter: Payer: Self-pay | Admitting: Women's Health

## 2017-10-02 DIAGNOSIS — Z0289 Encounter for other administrative examinations: Secondary | ICD-10-CM

## 2017-10-12 ENCOUNTER — Other Ambulatory Visit: Payer: Self-pay | Admitting: Women's Health

## 2017-10-12 DIAGNOSIS — F3289 Other specified depressive episodes: Secondary | ICD-10-CM

## 2017-10-25 ENCOUNTER — Telehealth: Payer: Self-pay | Admitting: *Deleted

## 2017-10-25 ENCOUNTER — Other Ambulatory Visit: Payer: Self-pay

## 2017-10-25 DIAGNOSIS — F3289 Other specified depressive episodes: Secondary | ICD-10-CM

## 2017-10-25 NOTE — Telephone Encounter (Signed)
Agree with refill of Wellbutrin until Annual Gyn exam.

## 2017-10-25 NOTE — Telephone Encounter (Signed)
Brianna Wolf patient )Patient now has annual exam scheduled on 11/27/17, needs refill on Wellbutrin XL 300 mg tablet. Please advise

## 2017-10-28 MED ORDER — BUPROPION HCL ER (XL) 300 MG PO TB24: ORAL_TABLET | ORAL | 1 refills | Status: DC

## 2017-10-28 NOTE — Telephone Encounter (Signed)
Rx sent,

## 2017-11-04 DIAGNOSIS — M654 Radial styloid tenosynovitis [de Quervain]: Secondary | ICD-10-CM | POA: Diagnosis not present

## 2017-11-27 ENCOUNTER — Encounter: Payer: Self-pay | Admitting: Women's Health

## 2017-11-27 ENCOUNTER — Ambulatory Visit (INDEPENDENT_AMBULATORY_CARE_PROVIDER_SITE_OTHER): Payer: BLUE CROSS/BLUE SHIELD | Admitting: Women's Health

## 2017-11-27 VITALS — BP 130/90 | Ht 63.0 in | Wt 140.0 lb

## 2017-11-27 DIAGNOSIS — F3289 Other specified depressive episodes: Secondary | ICD-10-CM

## 2017-11-27 DIAGNOSIS — Z13220 Encounter for screening for lipoid disorders: Secondary | ICD-10-CM | POA: Diagnosis not present

## 2017-11-27 DIAGNOSIS — Z13820 Encounter for screening for osteoporosis: Secondary | ICD-10-CM | POA: Diagnosis not present

## 2017-11-27 DIAGNOSIS — Z01419 Encounter for gynecological examination (general) (routine) without abnormal findings: Secondary | ICD-10-CM

## 2017-11-27 LAB — COMPREHENSIVE METABOLIC PANEL
AG Ratio: 2.1 (calc) (ref 1.0–2.5)
ALT: 12 U/L (ref 6–29)
AST: 14 U/L (ref 10–35)
Albumin: 4.5 g/dL (ref 3.6–5.1)
Alkaline phosphatase (APISO): 81 U/L (ref 33–130)
BUN: 14 mg/dL (ref 7–25)
CO2: 29 mmol/L (ref 20–32)
Calcium: 10.4 mg/dL (ref 8.6–10.4)
Chloride: 104 mmol/L (ref 98–110)
Creat: 0.97 mg/dL (ref 0.50–1.05)
Globulin: 2.1 g/dL (calc) (ref 1.9–3.7)
Glucose, Bld: 83 mg/dL (ref 65–99)
Potassium: 4 mmol/L (ref 3.5–5.3)
Sodium: 141 mmol/L (ref 135–146)
Total Bilirubin: 0.4 mg/dL (ref 0.2–1.2)
Total Protein: 6.6 g/dL (ref 6.1–8.1)

## 2017-11-27 LAB — CBC WITH DIFFERENTIAL/PLATELET
Basophils Absolute: 20 cells/uL (ref 0–200)
Basophils Relative: 0.5 %
Eosinophils Absolute: 211 cells/uL (ref 15–500)
Eosinophils Relative: 5.4 %
HCT: 40.7 % (ref 35.0–45.0)
Hemoglobin: 13.4 g/dL (ref 11.7–15.5)
Lymphs Abs: 1229 cells/uL (ref 850–3900)
MCH: 27.7 pg (ref 27.0–33.0)
MCHC: 32.9 g/dL (ref 32.0–36.0)
MCV: 84.3 fL (ref 80.0–100.0)
MPV: 11.6 fL (ref 7.5–12.5)
Monocytes Relative: 10.3 %
Neutro Abs: 2040 cells/uL (ref 1500–7800)
Neutrophils Relative %: 52.3 %
Platelets: 235 10*3/uL (ref 140–400)
RBC: 4.83 10*6/uL (ref 3.80–5.10)
RDW: 12.4 % (ref 11.0–15.0)
Total Lymphocyte: 31.5 %
WBC mixed population: 402 cells/uL (ref 200–950)
WBC: 3.9 10*3/uL (ref 3.8–10.8)

## 2017-11-27 LAB — LIPID PANEL
Cholesterol: 198 mg/dL (ref <200)
HDL: 82 mg/dL (ref >50)
LDL Cholesterol (Calc): 100 mg/dL (calc) — ABNORMAL HIGH
Non-HDL Cholesterol (Calc): 116 mg/dL (calc) (ref <130)
Total CHOL/HDL Ratio: 2.4 (calc) (ref <5.0)
Triglycerides: 69 mg/dL (ref <150)

## 2017-11-27 MED ORDER — BUPROPION HCL ER (XL) 300 MG PO TB24: ORAL_TABLET | ORAL | 4 refills | Status: DC

## 2017-11-27 NOTE — Progress Notes (Signed)
Brianna Wolf 10-Jul-1965 [id number]    History:    Presents for annual exam.  2010 LAVH for fibroids on no HRT.  Has had vaginal dryness with no relief with Estring, Vagifem or vaginal estrogen.  Normal Pap and mammogram history.  2018- colonoscopy.  Anxiety/depression stable on Wellbutrin and would like to continue.  History of migraines - rare.  Past medical history, past surgical history, family history and social history were all reviewed and documented in the EPIC chart.  Caring for 44-month-old grandson daily.  Husband prostate cancer doing well.  Vegetarian.  ROS:  A ROS was performed and pertinent positives and negatives are included.  Exam:  Vitals:   11/27/17 0924  BP: 130/90  Weight: 140 lb (63.5 kg)  Height: 5\' 3"  (1.6 m)   Body mass index is 24.8 kg/m.   General appearance:  Normal Thyroid:  Symmetrical, normal in size, without palpable masses or nodularity. Respiratory  Auscultation:  Clear without wheezing or rhonchi Cardiovascular  Auscultation:  Regular rate, without rubs, murmurs or gallops  Edema/varicosities:  Not grossly evident Abdominal  Soft,nontender, without masses, guarding or rebound.  Liver/spleen:  No organomegaly noted  Hernia:  None appreciated  Skin  Inspection:  Grossly normal   Breasts: Examined lying and sitting.     Right: Without masses, retractions, discharge or axillary adenopathy.     Left: Without masses, retractions, discharge or axillary adenopathy. Gentitourinary   Inguinal/mons:  Normal without inguinal adenopathy  External genitalia:  Normal  BUS/Urethra/Skene's glands:  Normal  Vagina: Atrophic  Cervix: And uterus absent adnexa/parametria:     Rt: Without masses or tenderness.   Lt: Without masses or tenderness.  Anus and perineum: Normal  Digital rectal exam: Normal sphincter tone without palpated masses or tenderness  Assessment/Plan:  52 y.o. MWF G3P3 for annual exam with complaint of dyspareunia due to vaginal dryness  with no relief with vaginal estrogens.  2010 LAVH for fibroids Vitiligo Anxiety depression stable on Wellbutrin  Plan: Vaginal estrogens reviewed, declines, will use topical vaginal  lubricants, reviewed and encouraged Replens to use on a regular basis.  SBE's, continue annual screening mammogram, calcium rich foods, vitamin D 2000 daily encouraged.  Schedule DEXA.  Reviewed importance of weightbearing and balance type exercise.  Wellbutrin 300 XL prescription, proper use given and reviewed counseling as needed has had in the past.  CBC, CMP, lipid panel,      [name] [facility], 9:55 AM 11/27/2017

## 2017-11-27 NOTE — Patient Instructions (Signed)
Health Maintenance for Postmenopausal Women Menopause is a normal process in which your reproductive ability comes to an end. This process happens gradually over a span of months to years, usually between the ages of 22 and 9. Menopause is complete when you have missed 12 consecutive menstrual periods. It is important to talk with your health care provider about some of the most common conditions that affect postmenopausal women, such as heart disease, cancer, and bone loss (osteoporosis). Adopting a healthy lifestyle and getting preventive care can help to promote your health and wellness. Those actions can also lower your chances of developing some of these common conditions. What should I know about menopause? During menopause, you may experience a number of symptoms, such as:  Moderate-to-severe hot flashes.  Night sweats.  Decrease in sex drive.  Mood swings.  Headaches.  Tiredness.  Irritability.  Memory problems.  Insomnia.  Choosing to treat or not to treat menopausal changes is an individual decision that you make with your health care provider. What should I know about hormone replacement therapy and supplements? Hormone therapy products are effective for treating symptoms that are associated with menopause, such as hot flashes and night sweats. Hormone replacement carries certain risks, especially as you become older. If you are thinking about using estrogen or estrogen with progestin treatments, discuss the benefits and risks with your health care provider. What should I know about heart disease and stroke? Heart disease, heart attack, and stroke become more likely as you age. This may be due, in part, to the hormonal changes that your body experiences during menopause. These can affect how your body processes dietary fats, triglycerides, and cholesterol. Heart attack and stroke are both medical emergencies. There are many things that you can do to help prevent heart disease  and stroke:  Have your blood pressure checked at least every 1-2 years. High blood pressure causes heart disease and increases the risk of stroke.  If you are 53-22 years old, ask your health care provider if you should take aspirin to prevent a heart attack or a stroke.  Do not use any tobacco products, including cigarettes, chewing tobacco, or electronic cigarettes. If you need help quitting, ask your health care provider.  It is important to eat a healthy diet and maintain a healthy weight. ? Be sure to include plenty of vegetables, fruits, low-fat dairy products, and lean protein. ? Avoid eating foods that are high in solid fats, added sugars, or salt (sodium).  Get regular exercise. This is one of the most important things that you can do for your health. ? Try to exercise for at least 150 minutes each week. The type of exercise that you do should increase your heart rate and make you sweat. This is known as moderate-intensity exercise. ? Try to do strengthening exercises at least twice each week. Do these in addition to the moderate-intensity exercise.  Know your numbers.Ask your health care provider to check your cholesterol and your blood glucose. Continue to have your blood tested as directed by your health care provider.  What should I know about cancer screening? There are several types of cancer. Take the following steps to reduce your risk and to catch any cancer development as early as possible. Breast Cancer  Practice breast self-awareness. ? This means understanding how your breasts normally appear and feel. ? It also means doing regular breast self-exams. Let your health care provider know about any changes, no matter how small.  If you are 40  or older, have a clinician do a breast exam (clinical breast exam or CBE) every year. Depending on your age, family history, and medical history, it may be recommended that you also have a yearly breast X-ray (mammogram).  If you  have a family history of breast cancer, talk with your health care provider about genetic screening.  If you are at high risk for breast cancer, talk with your health care provider about having an MRI and a mammogram every year.  Breast cancer (BRCA) gene test is recommended for women who have family members with BRCA-related cancers. Results of the assessment will determine the need for genetic counseling and BRCA1 and for BRCA2 testing. BRCA-related cancers include these types: ? Breast. This occurs in males or females. ? Ovarian. ? Tubal. This may also be called fallopian tube cancer. ? Cancer of the abdominal or pelvic lining (peritoneal cancer). ? Prostate. ? Pancreatic.  Cervical, Uterine, and Ovarian Cancer Your health care provider may recommend that you be screened regularly for cancer of the pelvic organs. These include your ovaries, uterus, and vagina. This screening involves a pelvic exam, which includes checking for microscopic changes to the surface of your cervix (Pap test).  For women ages 21-65, health care providers may recommend a pelvic exam and a Pap test every three years. For women ages 79-65, they may recommend the Pap test and pelvic exam, combined with testing for human papilloma virus (HPV), every five years. Some types of HPV increase your risk of cervical cancer. Testing for HPV may also be done on women of any age who have unclear Pap test results.  Other health care providers may not recommend any screening for nonpregnant women who are considered low risk for pelvic cancer and have no symptoms. Ask your health care provider if a screening pelvic exam is right for you.  If you have had past treatment for cervical cancer or a condition that could lead to cancer, you need Pap tests and screening for cancer for at least 20 years after your treatment. If Pap tests have been discontinued for you, your risk factors (such as having a new sexual partner) need to be  reassessed to determine if you should start having screenings again. Some women have medical problems that increase the chance of getting cervical cancer. In these cases, your health care provider may recommend that you have screening and Pap tests more often.  If you have a family history of uterine cancer or ovarian cancer, talk with your health care provider about genetic screening.  If you have vaginal bleeding after reaching menopause, tell your health care provider.  There are currently no reliable tests available to screen for ovarian cancer.  Lung Cancer Lung cancer screening is recommended for adults 69-62 years old who are at high risk for lung cancer because of a history of smoking. A yearly low-dose CT scan of the lungs is recommended if you:  Currently smoke.  Have a history of at least 30 pack-years of smoking and you currently smoke or have quit within the past 15 years. A pack-year is smoking an average of one pack of cigarettes per day for one year.  Yearly screening should:  Continue until it has been 15 years since you quit.  Stop if you develop a health problem that would prevent you from having lung cancer treatment.  Colorectal Cancer  This type of cancer can be detected and can often be prevented.  Routine colorectal cancer screening usually begins at  age 42 and continues through age 45.  If you have risk factors for colon cancer, your health care provider may recommend that you be screened at an earlier age.  If you have a family history of colorectal cancer, talk with your health care provider about genetic screening.  Your health care provider may also recommend using home test kits to check for hidden blood in your stool.  A small camera at the end of a tube can be used to examine your colon directly (sigmoidoscopy or colonoscopy). This is done to check for the earliest forms of colorectal cancer.  Direct examination of the colon should be repeated every  5-10 years until age 71. However, if early forms of precancerous polyps or small growths are found or if you have a family history or genetic risk for colorectal cancer, you may need to be screened more often.  Skin Cancer  Check your skin from head to toe regularly.  Monitor any moles. Be sure to tell your health care provider: ? About any new moles or changes in moles, especially if there is a change in a mole's shape or color. ? If you have a mole that is larger than the size of a pencil eraser.  If any of your family members has a history of skin cancer, especially at a young age, talk with your health care provider about genetic screening.  Always use sunscreen. Apply sunscreen liberally and repeatedly throughout the day.  Whenever you are outside, protect yourself by wearing long sleeves, pants, a wide-brimmed hat, and sunglasses.  What should I know about osteoporosis? Osteoporosis is a condition in which bone destruction happens more quickly than new bone creation. After menopause, you may be at an increased risk for osteoporosis. To help prevent osteoporosis or the bone fractures that can happen because of osteoporosis, the following is recommended:  If you are 46-71 years old, get at least 1,000 mg of calcium and at least 600 mg of vitamin D per day.  If you are older than age 55 but younger than age 65, get at least 1,200 mg of calcium and at least 600 mg of vitamin D per day.  If you are older than age 54, get at least 1,200 mg of calcium and at least 800 mg of vitamin D per day.  Smoking and excessive alcohol intake increase the risk of osteoporosis. Eat foods that are rich in calcium and vitamin D, and do weight-bearing exercises several times each week as directed by your health care provider. What should I know about how menopause affects my mental health? Depression may occur at any age, but it is more common as you become older. Common symptoms of depression  include:  Low or sad mood.  Changes in sleep patterns.  Changes in appetite or eating patterns.  Feeling an overall lack of motivation or enjoyment of activities that you previously enjoyed.  Frequent crying spells.  Talk with your health care provider if you think that you are experiencing depression. What should I know about immunizations? It is important that you get and maintain your immunizations. These include:  Tetanus, diphtheria, and pertussis (Tdap) booster vaccine.  Influenza every year before the flu season begins.  Pneumonia vaccine.  Shingles vaccine.  Your health care provider may also recommend other immunizations. This information is not intended to replace advice given to you by your health care provider. Make sure you discuss any questions you have with your health care provider. Document Released: 02/23/2005  Document Revised: 07/22/2015 Document Reviewed: 10/05/2014 Elsevier Interactive Patient Education  2018 [employer]  

## 2018-02-15 DIAGNOSIS — M810 Age-related osteoporosis without current pathological fracture: Secondary | ICD-10-CM

## 2018-02-15 HISTORY — DX: Age-related osteoporosis without current pathological fracture: M81.0

## 2018-02-20 ENCOUNTER — Other Ambulatory Visit: Payer: Self-pay | Admitting: Women's Health

## 2018-02-20 ENCOUNTER — Ambulatory Visit (INDEPENDENT_AMBULATORY_CARE_PROVIDER_SITE_OTHER): Payer: BLUE CROSS/BLUE SHIELD

## 2018-02-20 ENCOUNTER — Telehealth: Payer: Self-pay | Admitting: Gynecology

## 2018-02-20 ENCOUNTER — Encounter: Payer: Self-pay | Admitting: Gynecology

## 2018-02-20 ENCOUNTER — Other Ambulatory Visit: Payer: Self-pay | Admitting: Gynecology

## 2018-02-20 DIAGNOSIS — Z780 Asymptomatic menopausal state: Secondary | ICD-10-CM | POA: Diagnosis not present

## 2018-02-20 DIAGNOSIS — M810 Age-related osteoporosis without current pathological fracture: Secondary | ICD-10-CM | POA: Diagnosis not present

## 2018-02-20 DIAGNOSIS — M818 Other osteoporosis without current pathological fracture: Secondary | ICD-10-CM

## 2018-02-20 DIAGNOSIS — Z13820 Encounter for screening for osteoporosis: Secondary | ICD-10-CM

## 2018-02-20 NOTE — Telephone Encounter (Signed)
Tell patient her most recent bone density shows osteoporosis.  Surprising at her younger age.  Recommend comprehensive metabolic panel, TSH, PTH, vitamin D level and 24-hour urine collection for calcium excretion.  Recommend office visit to discuss lab results and treatment options after above tests are done.

## 2018-02-24 ENCOUNTER — Encounter: Payer: Self-pay | Admitting: *Deleted

## 2018-02-24 NOTE — Telephone Encounter (Signed)
Sent my chart messages, order placed.

## 2018-02-25 NOTE — Telephone Encounter (Signed)
Patient read my chart message

## 2018-03-07 ENCOUNTER — Other Ambulatory Visit: Payer: BLUE CROSS/BLUE SHIELD

## 2018-03-07 DIAGNOSIS — M818 Other osteoporosis without current pathological fracture: Secondary | ICD-10-CM | POA: Diagnosis not present

## 2018-03-08 LAB — COMPREHENSIVE METABOLIC PANEL
AG Ratio: 2.1 (calc) (ref 1.0–2.5)
ALT: 12 U/L (ref 6–29)
AST: 13 U/L (ref 10–35)
Albumin: 4.2 g/dL (ref 3.6–5.1)
Alkaline phosphatase (APISO): 84 U/L (ref 37–153)
BUN: 15 mg/dL (ref 7–25)
CO2: 25 mmol/L (ref 20–32)
Calcium: 9.9 mg/dL (ref 8.6–10.4)
Chloride: 105 mmol/L (ref 98–110)
Creat: 0.99 mg/dL (ref 0.50–1.05)
Globulin: 2 g/dL (calc) (ref 1.9–3.7)
Glucose, Bld: 110 mg/dL — ABNORMAL HIGH (ref 65–99)
Potassium: 4.3 mmol/L (ref 3.5–5.3)
Sodium: 141 mmol/L (ref 135–146)
Total Bilirubin: 0.3 mg/dL (ref 0.2–1.2)
Total Protein: 6.2 g/dL (ref 6.1–8.1)

## 2018-03-08 LAB — TSH: TSH: 4.03 mIU/L

## 2018-03-08 LAB — CALCIUM, URINE, 24 HOUR: Calcium, 24H Urine: 182 mg/24 h

## 2018-03-08 LAB — VITAMIN D 25 HYDROXY (VIT D DEFICIENCY, FRACTURES): Vit D, 25-Hydroxy: 30 ng/mL (ref 30–100)

## 2018-03-10 LAB — PARATHYROID HORMONE, INTACT (NO CA): PTH: 40 pg/mL (ref 14–64)

## 2018-03-19 ENCOUNTER — Ambulatory Visit: Payer: BLUE CROSS/BLUE SHIELD | Admitting: Gynecology

## 2018-03-19 ENCOUNTER — Encounter: Payer: Self-pay | Admitting: Gynecology

## 2018-03-19 VITALS — BP 118/76

## 2018-03-19 DIAGNOSIS — M810 Age-related osteoporosis without current pathological fracture: Secondary | ICD-10-CM | POA: Diagnosis not present

## 2018-03-19 MED ORDER — ALENDRONATE SODIUM 70 MG PO TABS: 70.0000 mg | ORAL_TABLET | ORAL | 11 refills | Status: DC

## 2018-03-19 NOTE — Progress Notes (Signed)
    Brianna Wolf [birth year] [id number]        53 y.o.  Brianna Wolf presents with her husband to discuss her most recent bone density which shows a T score of -2.6.  This is her first study.  No strong family history of osteoporosis.  Status post hysterectomy in the past but kept her ovaries.  No history of eating disorders.  Eats a regular diet and is active.  Had laboratory studies ordered which showed a normal calcium, TSH, PTH and 24-hour urine collection for calcium excretion.  Her vitamin D level was at 30 and she is on supplemental vitamin D now.  Past medical history,surgical history, problem list, medications, allergies, family history and social history were all reviewed and documented in the EPIC chart.  Directed ROS with pertinent positives and negatives documented in the history of present illness/assessment and plan.  Exam: Vitals:   03/19/18 1131  BP: 118/76   General appearance:  Normal   Assessment/Plan:  53 y.o. G3P3 with osteoporosis as a new finding.  Secondary work-up negative.  We reviewed the DEXA report in detail and I provided her with a copy of the report.  We discussed osteoporosis and the issues to include increased risk of fracture now as well as worsening risks as she gets older.  Options for management to include increasing weightbearing exercise, maximizing calcium and vitamin D as well as options for medication reviewed.  We discussed the bisphosphate's, Prolia, Evista,Evenity, Forteo.  The pros and cons of each choice discussed.  The risks also reviewed to include GERD, osteonecrosis of the jaw, atypical fractures, rashes, infections, thrombosis.  Options to monitor now and repeat the study in 2 years versus starting medication now also discussed.  After lengthy discussion they elected to start on alendronate 70 mg weekly.  We discussed how to take the medication and what side effects to look out for.  She will go ahead and start this and assuming she does well continue on  this and we will repeat her bone density in 2 years.  I spent a total of 25 face-to-face minutes with the patient, over 50% was spent counseling and coordination of care.     Anastasio Auerbach MD, 12:48 PM 03/19/2018

## 2018-03-19 NOTE — Patient Instructions (Signed)
Alendronate tablets  What is this medicine?  ALENDRONATE (a LEN droe nate) slows calcium loss from bones. It helps to make normal healthy bone and to slow bone loss in people with Paget's disease and osteoporosis. It may be used in others at risk for bone loss.  This medicine may be used for other purposes; ask your health care provider or pharmacist if you have questions.  COMMON BRAND NAME(S): Fosamax  What should I tell my health care provider before I take this medicine?  They need to know if you have any of these conditions:  -dental disease  -esophagus, stomach, or intestine problems, like acid reflux or GERD  -kidney disease  -low blood calcium  -low vitamin D  -problems sitting or standing 30 minutes  -trouble swallowing  -an unusual or allergic reaction to alendronate, other medicines, foods, dyes, or preservatives  -pregnant or trying to get pregnant  -breast-feeding      How should I use this medicine?  You must take this medicine exactly as directed or you will lower the amount of the medicine you absorb into your body or you may cause yourself harm. Take this medicine by mouth first thing in the morning, after you are up for the day. Do not eat or drink anything before you take your medicine. Swallow the tablet with a full glass (6 to 8 fluid ounces) of plain water. Do not take this medicine with any other drink. Do not chew or crush the tablet. After taking this medicine, do not eat breakfast, drink, or take any medicines or vitamins for at least 30 minutes. Sit or stand up for at least 30 minutes after you take this medicine; do not lie down. Do not take your medicine more often than directed.  Talk to your pediatrician regarding the use of this medicine in children. Special care may be needed.  Overdosage: If you think you have taken too much of this medicine contact a poison control center or emergency room at once.        NOTE: This medicine is only for you. Do not share this medicine with  others.  What if I miss a dose?  If you miss a dose, do not take it later in the day. Continue your normal schedule starting the next morning. Do not take double or extra doses.  What may interact with this medicine?  -aluminum hydroxide  -antacids  -aspirin  -calcium supplements  -drugs for inflammation like ibuprofen, naproxen, and others  -iron supplements  -magnesium supplements  -vitamins with minerals  This list may not describe all possible interactions. Give your health care provider a list of all the medicines, herbs, non-prescription drugs, or dietary supplements you use. Also tell them if you smoke, drink alcohol, or use illegal drugs. Some items may interact with your medicine.  What should I watch for while using this medicine?  Visit your doctor or health care professional for regular checks ups. It may be some time before you see benefit from this medicine. Do not stop taking your medicine except on your doctor's advice. Your doctor or health care professional may order blood tests and other tests to see how you are doing.  You should make sure you get enough calcium and vitamin D while you are taking this medicine, unless your doctor tells you not to. Discuss the foods you eat and the vitamins you take with your health care professional.  Some people who take this medicine have severe bone, joint,   and/or muscle pain. This medicine may also increase your risk for a broken thigh bone. Tell your doctor right away if you have pain in your upper leg or groin. Tell your doctor if you have any pain that does not go away or that gets worse.  This medicine can make you more sensitive to the sun. If you get a rash while taking this medicine, sunlight may cause the rash to get worse. Keep out of the sun. If you cannot avoid being in the sun, wear protective clothing and use sunscreen. Do not use sun lamps or tanning beds/booths.  What side effects may I notice from receiving this medicine?  Side effects that  you should report to your doctor or health care professional as soon as possible:  -allergic reactions like skin rash, itching or hives, swelling of the face, lips, or tongue  -black or tarry stools  -bone, muscle or joint pain  -changes in vision  -chest pain  -heartburn or stomach pain  -jaw pain, especially after dental work  -pain or trouble when swallowing  -redness, blistering, peeling or loosening of the skin, including inside the mouth  Side effects that usually do not require medical attention (report to your doctor or health care professional if they continue or are bothersome):  -changes in taste  -diarrhea or constipation  -eye pain or itching  -headache  -nausea or vomiting  -stomach gas or fullness  This list may not describe all possible side effects. Call your doctor for medical advice about side effects. You may report side effects to FDA at 1-800-FDA-1088.  Where should I keep my medicine?  Keep out of the reach of children.  Store at room temperature of 15 and 30 degrees C (59 and 86 degrees F). Throw away any unused medicine after the expiration date.  NOTE: This sheet is a summary. It may not cover all possible information. If you have questions about this medicine, talk to your doctor, pharmacist, or health care provider.   2019 Elsevier/Gold Standard (2010-06-30 08:56:09)

## 2018-04-17 DIAGNOSIS — M654 Radial styloid tenosynovitis [de Quervain]: Secondary | ICD-10-CM | POA: Diagnosis not present

## 2018-05-18 DIAGNOSIS — L089 Local infection of the skin and subcutaneous tissue, unspecified: Secondary | ICD-10-CM | POA: Diagnosis not present

## 2018-05-18 DIAGNOSIS — T148XXA Other injury of unspecified body region, initial encounter: Secondary | ICD-10-CM | POA: Diagnosis not present

## 2018-06-15 ENCOUNTER — Encounter: Payer: Self-pay | Admitting: Women's Health

## 2018-06-16 ENCOUNTER — Encounter: Payer: Self-pay | Admitting: Women's Health

## 2018-06-16 DIAGNOSIS — Z1231 Encounter for screening mammogram for malignant neoplasm of breast: Secondary | ICD-10-CM | POA: Diagnosis not present

## 2018-06-16 DIAGNOSIS — Z803 Family history of malignant neoplasm of breast: Secondary | ICD-10-CM | POA: Diagnosis not present

## 2018-07-04 DIAGNOSIS — B029 Zoster without complications: Secondary | ICD-10-CM | POA: Insufficient documentation

## 2018-07-08 ENCOUNTER — Other Ambulatory Visit: Payer: Self-pay

## 2018-07-08 ENCOUNTER — Emergency Department
Admission: EM | Admit: 2018-07-08 | Discharge: 2018-07-08 | Disposition: A | Discharge status: Home / Self Care | Payer: BC Managed Care – PPO | Attending: Student in an Organized Health Care Education/Training Program | Admitting: Student in an Organized Health Care Education/Training Program

## 2018-07-08 DIAGNOSIS — B029 Zoster without complications: Secondary | ICD-10-CM | POA: Diagnosis not present

## 2018-07-08 DIAGNOSIS — R0789 Other chest pain: Secondary | ICD-10-CM | POA: Insufficient documentation

## 2018-07-08 DIAGNOSIS — M25511 Pain in right shoulder: Secondary | ICD-10-CM | POA: Diagnosis present

## 2018-07-08 DIAGNOSIS — Z79899 Other long term (current) drug therapy: Secondary | ICD-10-CM | POA: Diagnosis not present

## 2018-07-08 MED ORDER — LIDOCAINE 5 % EX PTCH: 1.0000 | MEDICATED_PATCH | Freq: Two times a day (BID) | CUTANEOUS | 0 refills | Status: DC

## 2018-07-08 MED ORDER — PREDNISONE 20 MG PO TABS: 40.0000 mg | ORAL_TABLET | Freq: Every day | ORAL | 0 refills | Status: AC

## 2018-07-08 MED ORDER — VALACYCLOVIR HCL 1 G PO TABS: 1000.0000 mg | ORAL_TABLET | Freq: Three times a day (TID) | ORAL | 0 refills | Status: AC

## 2018-07-08 MED ORDER — LIDOCAINE 5 % EX PTCH
1.0000 | MEDICATED_PATCH | CUTANEOUS | Status: DC
  Administered 2018-07-08: 1 via TRANSDERMAL
  Filled 2018-07-08: qty 1

## 2018-07-08 MED ORDER — HYDROCODONE-ACETAMINOPHEN 5-325 MG PO TABS
1.0000 | ORAL_TABLET | Freq: Once | ORAL | Status: AC
  Administered 2018-07-08: 1 via ORAL
  Filled 2018-07-08: qty 1

## 2018-07-08 MED ORDER — HYDROCODONE-ACETAMINOPHEN 5-325 MG PO TABS: 1.0000 | ORAL_TABLET | ORAL | 0 refills | Status: DC | PRN

## 2018-07-08 NOTE — ED Triage Notes (Signed)
Pt arrives to ED via POV from home with c/o right shoulder pain since Sunday, with worsening tonight. Pt denies any recent fall, injury or trauma. Pt also c/o rash on the right side of her chest that she believes might be "shingles". Pt states the rash does not itch, is not painful and not burning.

## 2018-07-08 NOTE — ED Notes (Signed)
Assessment of pt's rash reveals a reddened area on the center chest with raised vesicles that have not ruptured or leaking fluid. First RN, Lattie Haw, made aware and pt moved to sub-wait area for safety.
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
## 2018-07-09 DIAGNOSIS — B029 Zoster without complications: Secondary | ICD-10-CM | POA: Diagnosis not present

## 2018-07-09 DIAGNOSIS — Z09 Encounter for follow-up examination after completed treatment for conditions other than malignant neoplasm: Secondary | ICD-10-CM | POA: Diagnosis not present

## 2018-07-11 DIAGNOSIS — B029 Zoster without complications: Secondary | ICD-10-CM | POA: Diagnosis not present

## 2018-07-21 DIAGNOSIS — B0229 Other postherpetic nervous system involvement: Secondary | ICD-10-CM | POA: Diagnosis not present

## 2018-07-23 DIAGNOSIS — B0229 Other postherpetic nervous system involvement: Secondary | ICD-10-CM | POA: Diagnosis not present

## 2018-08-26 ENCOUNTER — Encounter: Payer: Self-pay | Admitting: Women's Health

## 2018-08-27 ENCOUNTER — Other Ambulatory Visit: Payer: Self-pay | Admitting: Women's Health

## 2018-08-27 MED ORDER — ALPRAZOLAM 0.25 MG PO TABS: 0.2500 mg | ORAL_TABLET | Freq: Every evening | ORAL | 1 refills | Status: DC | PRN

## 2018-08-27 NOTE — Telephone Encounter (Signed)
Telephone call, states is having difficulty with res/tsleep , states unable to turn off the day.  Is aware of good sleep hygiene and is practicing it.  Minimal relief with Benadryl.  Reviewed we will try some melatonin.  Xanax 0.25 was called into pharmacy, reviewed it is addictive not to take daily and use sparingly.  Will call if continued problems.

## 2018-09-11 DIAGNOSIS — M654 Radial styloid tenosynovitis [de Quervain]: Secondary | ICD-10-CM | POA: Diagnosis not present

## 2018-09-19 DIAGNOSIS — Z01818 Encounter for other preprocedural examination: Secondary | ICD-10-CM | POA: Diagnosis not present

## 2018-09-24 DIAGNOSIS — M654 Radial styloid tenosynovitis [de Quervain]: Secondary | ICD-10-CM | POA: Diagnosis not present

## 2018-09-24 DIAGNOSIS — Z79899 Other long term (current) drug therapy: Secondary | ICD-10-CM | POA: Diagnosis not present

## 2018-09-26 ENCOUNTER — Encounter: Payer: Self-pay | Admitting: Emergency Medicine

## 2018-09-26 ENCOUNTER — Emergency Department
Admission: EM | Admit: 2018-09-26 | Discharge: 2018-09-26 | Disposition: A | Discharge status: Home / Self Care | Payer: BC Managed Care – PPO | Attending: Emergency Medicine | Admitting: Emergency Medicine

## 2018-09-26 ENCOUNTER — Emergency Department: Payer: BC Managed Care – PPO

## 2018-09-26 ENCOUNTER — Other Ambulatory Visit: Payer: Self-pay

## 2018-09-26 DIAGNOSIS — R1011 Right upper quadrant pain: Secondary | ICD-10-CM | POA: Diagnosis not present

## 2018-09-26 DIAGNOSIS — R112 Nausea with vomiting, unspecified: Secondary | ICD-10-CM | POA: Insufficient documentation

## 2018-09-26 DIAGNOSIS — R1010 Upper abdominal pain, unspecified: Secondary | ICD-10-CM | POA: Diagnosis present

## 2018-09-26 DIAGNOSIS — Z79899 Other long term (current) drug therapy: Secondary | ICD-10-CM | POA: Insufficient documentation

## 2018-09-26 DIAGNOSIS — K859 Acute pancreatitis without necrosis or infection, unspecified: Secondary | ICD-10-CM | POA: Diagnosis not present

## 2018-09-26 LAB — CBC WITH DIFFERENTIAL/PLATELET
Abs Immature Granulocytes: 0.02 K/uL (ref 0.00–0.07)
Basophils Absolute: 0 K/uL (ref 0.0–0.1)
Basophils Relative: 1 %
Eosinophils Absolute: 0.4 K/uL (ref 0.0–0.5)
Eosinophils Relative: 8 %
HCT: 40.9 % (ref 36.0–46.0)
Hemoglobin: 13.5 g/dL (ref 12.0–15.0)
Immature Granulocytes: 0 %
Lymphocytes Relative: 27 %
Lymphs Abs: 1.5 K/uL (ref 0.7–4.0)
MCH: 28.2 pg (ref 26.0–34.0)
MCHC: 33 g/dL (ref 30.0–36.0)
MCV: 85.6 fL (ref 80.0–100.0)
Monocytes Absolute: 0.5 K/uL (ref 0.1–1.0)
Monocytes Relative: 9 %
Neutro Abs: 3 K/uL (ref 1.7–7.7)
Neutrophils Relative %: 55 %
Platelets: 215 K/uL (ref 150–400)
RBC: 4.78 MIL/uL (ref 3.87–5.11)
RDW: 12.9 % (ref 11.5–15.5)
WBC: 5.5 K/uL (ref 4.0–10.5)
nRBC: 0 % (ref 0.0–0.2)

## 2018-09-26 LAB — COMPREHENSIVE METABOLIC PANEL WITH GFR
ALT: 12 U/L (ref 0–44)
AST: 16 U/L (ref 15–41)
Albumin: 4.1 g/dL (ref 3.5–5.0)
Alkaline Phosphatase: 51 U/L (ref 38–126)
Anion gap: 7 (ref 5–15)
BUN: 16 mg/dL (ref 6–20)
CO2: 26 mmol/L (ref 22–32)
Calcium: 8.7 mg/dL — ABNORMAL LOW (ref 8.9–10.3)
Chloride: 110 mmol/L (ref 98–111)
Creatinine, Ser: 0.92 mg/dL (ref 0.44–1.00)
GFR calc Af Amer: >60 mL/min
GFR calc non Af Amer: >60 mL/min
Glucose, Bld: 103 mg/dL — ABNORMAL HIGH (ref 70–99)
Potassium: 3.5 mmol/L (ref 3.5–5.1)
Sodium: 143 mmol/L (ref 135–145)
Total Bilirubin: 0.4 mg/dL (ref 0.3–1.2)
Total Protein: 6.4 g/dL — ABNORMAL LOW (ref 6.5–8.1)

## 2018-09-26 LAB — LIPASE, BLOOD: Lipase: 165 U/L — ABNORMAL HIGH (ref 11–51)

## 2018-09-26 LAB — TROPONIN I (HIGH SENSITIVITY): Troponin I (High Sensitivity): 3 ng/L

## 2018-09-26 LAB — TRIGLYCERIDES: Triglycerides: 60 mg/dL

## 2018-09-26 MED ORDER — OXYCODONE-ACETAMINOPHEN 5-325 MG PO TABS: 1.0000 | ORAL_TABLET | ORAL | 0 refills | Status: DC | PRN

## 2018-09-26 MED ORDER — OXYCODONE-ACETAMINOPHEN 5-325 MG PO TABS
2.0000 | ORAL_TABLET | Freq: Once | ORAL | Status: AC
  Administered 2018-09-26: 2 via ORAL
  Filled 2018-09-26: qty 2

## 2018-09-26 NOTE — ED Triage Notes (Signed)
Patient ambulatory to triage with steady gait, without difficulty or distress noted, mask in place; pt reports since yesterday having mid upper abd pain accomp by N/V; denies hx of same
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
## 2018-09-26 NOTE — ED Notes (Signed)
E-signature was signed by patient on a printed out copy. Signature was placed in the bin for medical records pick up.

## 2018-09-26 NOTE — ED Notes (Addendum)
Pain started yesterday and went away and then started this morning at 0430am. Pt st that she had a sharp stabbing pain that worsened as time progressive. Not tender on palpation. Bowel sounds audibile in all 4 quadrants. Pt st her last meal was yesterday and she couldn't "eat my usual amount, I felt full". Pt st that "the pain didn't come after I ate, it happened later on in the night".

## 2018-10-08 ENCOUNTER — Encounter: Payer: Self-pay | Admitting: Gynecology

## 2018-11-28 ENCOUNTER — Other Ambulatory Visit: Payer: Self-pay

## 2018-12-01 ENCOUNTER — Encounter: Payer: Self-pay | Admitting: Women's Health

## 2018-12-01 ENCOUNTER — Other Ambulatory Visit: Payer: Self-pay

## 2018-12-01 ENCOUNTER — Ambulatory Visit (INDEPENDENT_AMBULATORY_CARE_PROVIDER_SITE_OTHER): Payer: BC Managed Care – PPO | Admitting: Women's Health

## 2018-12-01 VITALS — BP 124/80 | Ht 63.0 in | Wt 134.0 lb

## 2018-12-01 DIAGNOSIS — Z01419 Encounter for gynecological examination (general) (routine) without abnormal findings: Secondary | ICD-10-CM

## 2018-12-01 DIAGNOSIS — F3289 Other specified depressive episodes: Secondary | ICD-10-CM | POA: Diagnosis not present

## 2018-12-01 DIAGNOSIS — Z23 Encounter for immunization: Secondary | ICD-10-CM | POA: Diagnosis not present

## 2018-12-01 MED ORDER — BUPROPION HCL ER (XL) 300 MG PO TB24: 300.0000 mg | ORAL_TABLET | Freq: Every day | ORAL | 4 refills | Status: DC

## 2018-12-01 MED ORDER — ALENDRONATE SODIUM 70 MG PO TABS: 70.0000 mg | ORAL_TABLET | ORAL | 4 refills | Status: DC

## 2018-12-01 NOTE — Patient Instructions (Signed)
Good to see you! Vit D 2000 iu daily  Health Maintenance for Postmenopausal Women Menopause is a normal process in which your ability to get pregnant comes to an end. This process happens slowly over many months or years, usually between the ages of 90 and 77. Menopause is complete when you have missed your menstrual periods for 12 months. It is important to talk with your health care provider about some of the most common conditions that affect women after menopause (postmenopausal women). These include heart disease, cancer, and bone loss (osteoporosis). Adopting a healthy lifestyle and getting preventive care can help to promote your health and wellness. The actions you take can also lower your chances of developing some of these common conditions. What should I know about menopause? During menopause, you may get a number of symptoms, such as:  Hot flashes. These can be moderate or severe.  Night sweats.  Decrease in sex drive.  Mood swings.  Headaches.  Tiredness.  Irritability.  Memory problems.  Insomnia. Choosing to treat or not to treat these symptoms is a decision that you make with your health care provider. Do I need hormone replacement therapy?  Hormone replacement therapy is effective in treating symptoms that are caused by menopause, such as hot flashes and night sweats.  Hormone replacement carries certain risks, especially as you become older. If you are thinking about using estrogen or estrogen with progestin, discuss the benefits and risks with your health care provider. What is my risk for heart disease and stroke? The risk of heart disease, heart attack, and stroke increases as you age. One of the causes may be a change in the body's hormones during menopause. This can affect how your body uses dietary fats, triglycerides, and cholesterol. Heart attack and stroke are medical emergencies. There are many things that you can do to help prevent heart disease and  stroke. Watch your blood pressure  High blood pressure causes heart disease and increases the risk of stroke. This is more likely to develop in people who have high blood pressure readings, are of African descent, or are overweight.  Have your blood pressure checked: ? Every 3-5 years if you are 26-19 years of age. ? Every year if you are 84 years old or older. Eat a healthy diet   Eat a diet that includes plenty of vegetables, fruits, low-fat dairy products, and lean protein.  Do not eat a lot of foods that are high in solid fats, added sugars, or sodium. Get regular exercise Get regular exercise. This is one of the most important things you can do for your health. Most adults should:  Try to exercise for at least 150 minutes each week. The exercise should increase your heart rate and make you sweat (moderate-intensity exercise).  Try to do strengthening exercises at least twice each week. Do these in addition to the moderate-intensity exercise.  Spend less time sitting. Even light physical activity can be beneficial. Other tips  Work with your health care provider to achieve or maintain a healthy weight.  Do not use any products that contain nicotine or tobacco, such as cigarettes, e-cigarettes, and chewing tobacco. If you need help quitting, ask your health care provider.  Know your numbers. Ask your health care provider to check your cholesterol and your blood sugar (glucose). Continue to have your blood tested as directed by your health care provider. Do I need screening for cancer? Depending on your health history and family history, you may need to  have cancer screening at different stages of your life. This may include screening for:  Breast cancer.  Cervical cancer.  Lung cancer.  Colorectal cancer. What is my risk for osteoporosis? After menopause, you may be at increased risk for osteoporosis. Osteoporosis is a condition in which bone destruction happens more  quickly than new bone creation. To help prevent osteoporosis or the bone fractures that can happen because of osteoporosis, you may take the following actions:  If you are 62-24 years old, get at least 1,000 mg of calcium and at least 600 mg of vitamin D per day.  If you are older than age 49 but younger than age 46, get at least 1,200 mg of calcium and at least 600 mg of vitamin D per day.  If you are older than age 40, get at least 1,200 mg of calcium and at least 800 mg of vitamin D per day. Smoking and drinking excessive alcohol increase the risk of osteoporosis. Eat foods that are rich in calcium and vitamin D, and do weight-bearing exercises several times each week as directed by your health care provider. How does menopause affect my mental health? Depression may occur at any age, but it is more common as you become older. Common symptoms of depression include:  Low or sad mood.  Changes in sleep patterns.  Changes in appetite or eating patterns.  Feeling an overall lack of motivation or enjoyment of activities that you previously enjoyed.  Frequent crying spells. Talk with your health care provider if you think that you are experiencing depression. General instructions See your health care provider for regular wellness exams and vaccines. This may include:  Scheduling regular health, dental, and eye exams.  Getting and maintaining your vaccines. These include: ? Influenza vaccine. Get this vaccine each year before the flu season begins. ? Pneumonia vaccine. ? Shingles vaccine. ? Tetanus, diphtheria, and pertussis (Tdap) booster vaccine. Your health care provider may also recommend other immunizations. Tell your health care provider if you have ever been abused or do not feel safe at home. Summary  Menopause is a normal process in which your ability to get pregnant comes to an end.  This condition causes hot flashes, night sweats, decreased interest in sex, mood swings,  headaches, or lack of sleep.  Treatment for this condition may include hormone replacement therapy.  Take actions to keep yourself healthy, including exercising regularly, eating a healthy diet, watching your weight, and checking your blood pressure and blood sugar levels.  Get screened for cancer and depression. Make sure that you are up to date with all your vaccines. This information is not intended to replace advice given to you by your health care provider. Make sure you discuss any questions you have with your health care provider. Document Released: 02/23/2005 Document Revised: 12/25/2017 Document Reviewed: 12/25/2017 Elsevier Patient Education  2020 [employer].

## 2018-12-01 NOTE — Progress Notes (Signed)
Brianna Wolf 1965/08/19 [id number]    History:    Presents for annual exam.  2010 LAVH for benign reasons.  02/2018 osteoporosis T score -2.6 with normal PTH, calcium and TSH on Fosamax tolerating well.  09/2018 pancreatitis.  06/2018 shingles which caused excruciating back pain.  Negative colonoscopy at [facility] 3 to 4 years ago per patient.  09/2018 carpal tunnel surgery  Past medical history, past surgical history, family history and social history were all reviewed and documented in the EPIC chart.  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  Husband history of prostate cancer doing well.  3 children all doing well.  ROS:  A ROS was performed and pertinent positives and negatives are included.  Exam:  Vitals:   12/01/18 0934  BP: 124/80  Weight: 134 lb (60.8 kg)  Height: 5\' 3"  (1.6 m)   Body mass index is 23.74 kg/m.   General appearance:  Normal Thyroid:  Symmetrical, normal in size, without palpable masses or nodularity. Respiratory  Auscultation:  Clear without wheezing or rhonchi Cardiovascular  Auscultation:  Regular rate, without rubs, murmurs or gallops  Edema/varicosities:  Not grossly evident Abdominal  Soft,nontender, without masses, guarding or rebound.  Liver/spleen:  No organomegaly noted  Hernia:  None appreciated  Skin  Inspection:  Grossly normal   Breasts: Examined lying and sitting.     Right: Without masses, retractions, discharge or axillary adenopathy.     Left: Without masses, retractions, discharge or axillary adenopathy. Gentitourinary   Inguinal/mons:  Normal without inguinal adenopathy  External genitalia:  Normal  BUS/Urethra/Skene's glands:  Normal  Vagina:  Normal  Cervix: And uterus absent   Adnexa/parametria:     Rt: Without masses or tenderness.   Lt: Without masses or tenderness.  Anus and perineum: Normal  Digital rectal exam: Normal sphincter tone without palpated masses or tenderness  Assessment/Plan:  53 y.o. MWF G3, P3 for annual exam with no  complaints.  2010 LAVH on no HRT 02/2018 osteoporosis on Fosamax tolerating well Anxiety/depression stable on Wellbutrin 300 mg daily 06/2018 shingles  Plan: Fosamax 70 mg p.o. weekly prescription, proper use given and reviewed best to stay on for 5 years if improvement is seen in next DEXA. Home safety, fall prevention and importance of weightbearing exercise reviewed, as healthy lifestyle of regular exercise, strength training and yoga.  SBEs, continue annual screening mammogram, calcium rich foods, vitamin D 2000 daily encouraged.  Has had numerous normal labs this past year.  We will continue Wellbutrin 300 mg daily prescription, proper use given and reviewed has had counseling and aware to seek counseling as needed.  Shingrix discussed reviewed importance of waiting until at least 6 months after asymptomatic for vaccine.  Self-care, leisure activities encouraged.    [place], 1:28 PM 12/01/2018

## 2018-12-27 DIAGNOSIS — J0100 Acute maxillary sinusitis, unspecified: Secondary | ICD-10-CM | POA: Diagnosis not present

## 2018-12-27 DIAGNOSIS — R030 Elevated blood-pressure reading, without diagnosis of hypertension: Secondary | ICD-10-CM | POA: Diagnosis not present

## 2019-02-04 ENCOUNTER — Other Ambulatory Visit: Payer: Self-pay | Admitting: Women's Health

## 2019-02-04 DIAGNOSIS — F3289 Other specified depressive episodes: Secondary | ICD-10-CM

## 2019-04-21 ENCOUNTER — Other Ambulatory Visit: Payer: Self-pay | Admitting: Women's Health

## 2019-04-21 MED ORDER — BUPROPION HCL ER (XL) 150 MG PO TB24: 150.0000 mg | ORAL_TABLET | Freq: Every day | ORAL | 1 refills | Status: DC

## 2019-04-21 MED ORDER — SERTRALINE HCL 25 MG PO TABS: 25.0000 mg | ORAL_TABLET | Freq: Every day | ORAL | 1 refills | Status: DC

## 2019-05-12 ENCOUNTER — Other Ambulatory Visit: Payer: Self-pay | Admitting: Women's Health

## 2019-05-12 MED ORDER — SERTRALINE HCL 25 MG PO TABS: 25.0000 mg | ORAL_TABLET | Freq: Every day | ORAL | 2 refills | Status: DC

## 2019-05-12 MED ORDER — BUPROPION HCL ER (XL) 150 MG PO TB24: 150.0000 mg | ORAL_TABLET | Freq: Every day | ORAL | 2 refills | Status: DC

## 2019-05-12 NOTE — Progress Notes (Signed)
TC, states doing well on zoloft 25 and the wellbutrin 150 and would like to continue, refills sent in

## 2019-06-28 DIAGNOSIS — J0100 Acute maxillary sinusitis, unspecified: Secondary | ICD-10-CM | POA: Diagnosis not present

## 2019-06-28 DIAGNOSIS — J00 Acute nasopharyngitis [common cold]: Secondary | ICD-10-CM | POA: Diagnosis not present

## 2019-07-25 ENCOUNTER — Other Ambulatory Visit: Payer: Self-pay

## 2019-07-25 DIAGNOSIS — X19XXXA Contact with other heat and hot substances, initial encounter: Secondary | ICD-10-CM | POA: Insufficient documentation

## 2019-07-25 DIAGNOSIS — Z5321 Procedure and treatment not carried out due to patient leaving prior to being seen by health care provider: Secondary | ICD-10-CM | POA: Insufficient documentation

## 2019-07-25 DIAGNOSIS — T23002A Burn of unspecified degree of left hand, unspecified site, initial encounter: Secondary | ICD-10-CM | POA: Insufficient documentation

## 2019-07-25 DIAGNOSIS — Y939 Activity, unspecified: Secondary | ICD-10-CM | POA: Insufficient documentation

## 2019-07-25 DIAGNOSIS — Y999 Unspecified external cause status: Secondary | ICD-10-CM | POA: Insufficient documentation

## 2019-07-25 DIAGNOSIS — Y929 Unspecified place or not applicable: Secondary | ICD-10-CM | POA: Insufficient documentation
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
## 2019-07-26 ENCOUNTER — Emergency Department
Admission: EM | Admit: 2019-07-26 | Discharge: 2019-07-26 | Disposition: A | Discharge status: Home / Self Care | Payer: Self-pay | Attending: Emergency Medicine | Admitting: Emergency Medicine

## 2019-07-26 NOTE — ED Notes (Signed)
Pt informed registration desk that she was leaving and would follow up at urgent care in the morning if worsened.

## 2019-10-16 ENCOUNTER — Encounter: Payer: Self-pay | Admitting: Nurse Practitioner

## 2019-10-16 DIAGNOSIS — Z1231 Encounter for screening mammogram for malignant neoplasm of breast: Secondary | ICD-10-CM | POA: Diagnosis not present

## 2019-11-18 DIAGNOSIS — M25571 Pain in right ankle and joints of right foot: Secondary | ICD-10-CM | POA: Diagnosis not present

## 2019-11-25 DIAGNOSIS — Z20822 Contact with and (suspected) exposure to covid-19: Secondary | ICD-10-CM | POA: Diagnosis not present

## 2019-11-25 DIAGNOSIS — J209 Acute bronchitis, unspecified: Secondary | ICD-10-CM | POA: Diagnosis not present

## 2019-11-25 DIAGNOSIS — R059 Cough, unspecified: Secondary | ICD-10-CM | POA: Diagnosis not present

## 2019-11-25 DIAGNOSIS — Z03818 Encounter for observation for suspected exposure to other biological agents ruled out: Secondary | ICD-10-CM | POA: Diagnosis not present

## 2019-12-02 ENCOUNTER — Encounter: Payer: BC Managed Care – PPO | Admitting: Nurse Practitioner

## 2019-12-03 ENCOUNTER — Ambulatory Visit (INDEPENDENT_AMBULATORY_CARE_PROVIDER_SITE_OTHER): Payer: BC Managed Care – PPO | Admitting: Obstetrics and Gynecology

## 2019-12-03 ENCOUNTER — Encounter: Payer: Self-pay | Admitting: Obstetrics and Gynecology

## 2019-12-03 ENCOUNTER — Other Ambulatory Visit: Payer: Self-pay

## 2019-12-03 VITALS — BP 130/90 | Ht 63.0 in | Wt 149.0 lb

## 2019-12-03 DIAGNOSIS — F419 Anxiety disorder, unspecified: Secondary | ICD-10-CM

## 2019-12-03 DIAGNOSIS — Z1231 Encounter for screening mammogram for malignant neoplasm of breast: Secondary | ICD-10-CM

## 2019-12-03 DIAGNOSIS — Z13820 Encounter for screening for osteoporosis: Secondary | ICD-10-CM | POA: Diagnosis not present

## 2019-12-03 DIAGNOSIS — Z23 Encounter for immunization: Secondary | ICD-10-CM

## 2019-12-03 DIAGNOSIS — Z01419 Encounter for gynecological examination (general) (routine) without abnormal findings: Secondary | ICD-10-CM

## 2019-12-03 DIAGNOSIS — F32A Depression, unspecified: Secondary | ICD-10-CM

## 2019-12-03 MED ORDER — BUPROPION HCL ER (XL) 150 MG PO TB24: 150.0000 mg | ORAL_TABLET | Freq: Every day | ORAL | 3 refills | Status: AC

## 2019-12-03 MED ORDER — SERTRALINE HCL 25 MG PO TABS: 25.0000 mg | ORAL_TABLET | Freq: Every day | ORAL | 3 refills | Status: AC
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
## 2019-12-03 NOTE — Patient Instructions (Signed)
I value your feedback and entrusting us with your care. If you get a [facility] patient survey, I would appreciate you taking the time to let us know about your experience today. Thank you!  As of December 25, 2018, your lab results will be released to your MyChart immediately, before I even have a chance to see them. Please give me time to review them and contact you if there are any abnormalities. Thank you for your patience.  

## 2020-03-27 DIAGNOSIS — J209 Acute bronchitis, unspecified: Secondary | ICD-10-CM | POA: Diagnosis not present

## 2020-03-27 DIAGNOSIS — Z03818 Encounter for observation for suspected exposure to other biological agents ruled out: Secondary | ICD-10-CM | POA: Diagnosis not present

## 2020-04-04 DIAGNOSIS — J0100 Acute maxillary sinusitis, unspecified: Secondary | ICD-10-CM | POA: Diagnosis not present

## 2020-05-29 DIAGNOSIS — S93401A Sprain of unspecified ligament of right ankle, initial encounter: Secondary | ICD-10-CM | POA: Diagnosis not present

## 2020-06-14 DIAGNOSIS — M722 Plantar fascial fibromatosis: Secondary | ICD-10-CM | POA: Diagnosis not present

## 2020-06-14 DIAGNOSIS — S92354A Nondisplaced fracture of fifth metatarsal bone, right foot, initial encounter for closed fracture: Secondary | ICD-10-CM | POA: Diagnosis not present

## 2020-06-14 DIAGNOSIS — S93401A Sprain of unspecified ligament of right ankle, initial encounter: Secondary | ICD-10-CM | POA: Diagnosis not present

## 2020-07-04 DIAGNOSIS — S93401A Sprain of unspecified ligament of right ankle, initial encounter: Secondary | ICD-10-CM | POA: Diagnosis not present

## 2020-07-19 DIAGNOSIS — E039 Hypothyroidism, unspecified: Secondary | ICD-10-CM | POA: Diagnosis not present

## 2020-07-19 DIAGNOSIS — R7309 Other abnormal glucose: Secondary | ICD-10-CM | POA: Diagnosis not present

## 2020-07-19 DIAGNOSIS — E7800 Pure hypercholesterolemia, unspecified: Secondary | ICD-10-CM | POA: Diagnosis not present

## 2020-07-19 DIAGNOSIS — E559 Vitamin D deficiency, unspecified: Secondary | ICD-10-CM | POA: Diagnosis not present

## 2020-07-19 DIAGNOSIS — D519 Vitamin B12 deficiency anemia, unspecified: Secondary | ICD-10-CM | POA: Diagnosis not present

## 2020-08-04 ENCOUNTER — Other Ambulatory Visit: Payer: Self-pay | Admitting: Adult Health

## 2020-08-04 DIAGNOSIS — M810 Age-related osteoporosis without current pathological fracture: Secondary | ICD-10-CM

## 2020-08-23 ENCOUNTER — Encounter: Payer: Self-pay | Admitting: Adult Health

## 2020-09-15 DIAGNOSIS — R5383 Other fatigue: Secondary | ICD-10-CM | POA: Diagnosis not present

## 2020-09-15 DIAGNOSIS — E282 Polycystic ovarian syndrome: Secondary | ICD-10-CM | POA: Diagnosis not present

## 2020-09-29 ENCOUNTER — Other Ambulatory Visit: Payer: Self-pay | Admitting: Adult Health

## 2020-09-29 DIAGNOSIS — Z1231 Encounter for screening mammogram for malignant neoplasm of breast: Secondary | ICD-10-CM

## 2020-10-17 ENCOUNTER — Other Ambulatory Visit: Payer: Self-pay

## 2020-10-18 ENCOUNTER — Encounter: Payer: Self-pay | Admitting: Gastroenterology

## 2020-10-18 ENCOUNTER — Other Ambulatory Visit: Payer: Self-pay

## 2020-10-18 ENCOUNTER — Ambulatory Visit: Payer: BC Managed Care – PPO | Admitting: Gastroenterology

## 2020-10-18 VITALS — BP 138/78 | HR 60 | Temp 98.1°F | Ht 63.0 in | Wt 154.0 lb

## 2020-10-18 DIAGNOSIS — R1319 Other dysphagia: Secondary | ICD-10-CM

## 2020-10-18 DIAGNOSIS — K635 Polyp of colon: Secondary | ICD-10-CM | POA: Diagnosis not present

## 2020-10-18 DIAGNOSIS — K859 Acute pancreatitis without necrosis or infection, unspecified: Secondary | ICD-10-CM

## 2020-10-18 HISTORY — DX: Acute pancreatitis without necrosis or infection, unspecified: K85.90

## 2020-10-18 MED ORDER — GOLYTELY 236 G PO SOLR: 4000.0000 mL | Freq: Once | ORAL | 0 refills | Status: AC

## 2020-10-18 NOTE — Addendum Note (Signed)
Addended by: Eliseo Squires on: 10/18/2020 11:15 AM   Modules accepted: Orders, SmartSet
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
## 2020-10-25 ENCOUNTER — Ambulatory Visit
Admission: RE | Admit: 2020-10-25 | Discharge: 2020-10-25 | Disposition: A | Discharge status: Home / Self Care | Payer: BC Managed Care – PPO | Source: Ambulatory Visit | Attending: Adult Health | Admitting: Adult Health

## 2020-10-25 ENCOUNTER — Other Ambulatory Visit: Payer: Self-pay

## 2020-10-25 DIAGNOSIS — Z780 Asymptomatic menopausal state: Secondary | ICD-10-CM | POA: Diagnosis not present

## 2020-10-25 DIAGNOSIS — M810 Age-related osteoporosis without current pathological fracture: Secondary | ICD-10-CM

## 2020-10-25 DIAGNOSIS — Z1231 Encounter for screening mammogram for malignant neoplasm of breast: Secondary | ICD-10-CM

## 2020-10-25 DIAGNOSIS — M8589 Other specified disorders of bone density and structure, multiple sites: Secondary | ICD-10-CM | POA: Diagnosis not present

## 2020-10-27 ENCOUNTER — Inpatient Hospital Stay
Admission: RE | Admit: 2020-10-27 | Discharge: 2020-10-27 | Disposition: A | Discharge status: Home / Self Care | Payer: Self-pay | Source: Ambulatory Visit | Attending: *Deleted | Admitting: *Deleted

## 2020-10-27 ENCOUNTER — Other Ambulatory Visit: Payer: Self-pay | Admitting: *Deleted

## 2020-10-27 DIAGNOSIS — Z1231 Encounter for screening mammogram for malignant neoplasm of breast: Secondary | ICD-10-CM

## 2020-11-01 ENCOUNTER — Other Ambulatory Visit: Payer: Self-pay | Admitting: Internal Medicine

## 2020-11-01 DIAGNOSIS — R921 Mammographic calcification found on diagnostic imaging of breast: Secondary | ICD-10-CM

## 2020-11-01 DIAGNOSIS — R928 Other abnormal and inconclusive findings on diagnostic imaging of breast: Secondary | ICD-10-CM

## 2020-11-09 DIAGNOSIS — H9201 Otalgia, right ear: Secondary | ICD-10-CM | POA: Diagnosis not present

## 2020-11-09 DIAGNOSIS — J069 Acute upper respiratory infection, unspecified: Secondary | ICD-10-CM | POA: Diagnosis not present

## 2020-11-09 DIAGNOSIS — J029 Acute pharyngitis, unspecified: Secondary | ICD-10-CM | POA: Diagnosis not present

## 2020-11-10 ENCOUNTER — Other Ambulatory Visit: Payer: Self-pay

## 2020-11-10 ENCOUNTER — Ambulatory Visit
Admission: RE | Admit: 2020-11-10 | Discharge: 2020-11-10 | Disposition: A | Discharge status: Home / Self Care | Payer: BC Managed Care – PPO | Source: Ambulatory Visit | Attending: Internal Medicine | Admitting: Internal Medicine

## 2020-11-10 DIAGNOSIS — R921 Mammographic calcification found on diagnostic imaging of breast: Secondary | ICD-10-CM | POA: Diagnosis not present

## 2020-11-10 DIAGNOSIS — R922 Inconclusive mammogram: Secondary | ICD-10-CM | POA: Diagnosis not present

## 2020-11-10 DIAGNOSIS — R928 Other abnormal and inconclusive findings on diagnostic imaging of breast: Secondary | ICD-10-CM | POA: Diagnosis not present

## 2020-11-11 ENCOUNTER — Encounter: Payer: Self-pay | Admitting: Gastroenterology

## 2020-11-13 DIAGNOSIS — H60501 Unspecified acute noninfective otitis externa, right ear: Secondary | ICD-10-CM | POA: Diagnosis not present

## 2020-11-13 DIAGNOSIS — R0982 Postnasal drip: Secondary | ICD-10-CM | POA: Diagnosis not present

## 2020-11-13 DIAGNOSIS — J029 Acute pharyngitis, unspecified: Secondary | ICD-10-CM | POA: Diagnosis not present

## 2020-11-14 ENCOUNTER — Encounter: Payer: Self-pay | Admitting: Certified Registered Nurse Anesthetist

## 2020-11-14 ENCOUNTER — Encounter: Admission: RE | Payer: Self-pay | Source: Home / Self Care

## 2020-11-14 ENCOUNTER — Ambulatory Visit
Admission: RE | Admit: 2020-11-14 | Payer: BC Managed Care – PPO | Source: Home / Self Care | Admitting: Gastroenterology

## 2020-11-14 SURGERY — COLONOSCOPY WITH PROPOFOL
Anesthesia: General

## 2020-11-15 ENCOUNTER — Telehealth: Payer: Self-pay

## 2020-11-15 ENCOUNTER — Other Ambulatory Visit: Payer: Self-pay

## 2020-11-15 DIAGNOSIS — K635 Polyp of colon: Secondary | ICD-10-CM

## 2020-11-15 MED ORDER — PEG 3350-KCL-NA BICARB-NACL 420 G PO SOLR: 4000.0000 mL | Freq: Once | ORAL | 0 refills | Status: AC

## 2020-11-15 NOTE — Telephone Encounter (Signed)
Called patient to find out why no show to procedure yesterday she is sick woke up sick Saturday morning but I got her rescheduled for 12/13/2020

## 2020-12-13 ENCOUNTER — Ambulatory Visit: Payer: BC Managed Care – PPO | Admitting: Anesthesiology

## 2020-12-13 ENCOUNTER — Encounter: Payer: Self-pay | Admitting: Gastroenterology

## 2020-12-13 ENCOUNTER — Encounter
Admission: RE | Disposition: A | Discharge status: Home / Self Care | Payer: Self-pay | Source: Home / Self Care | Attending: Gastroenterology

## 2020-12-13 ENCOUNTER — Ambulatory Visit
Admission: RE | Admit: 2020-12-13 | Discharge: 2020-12-13 | Disposition: A | Discharge status: Home / Self Care | Payer: BC Managed Care – PPO | Attending: Gastroenterology | Admitting: Gastroenterology

## 2020-12-13 ENCOUNTER — Other Ambulatory Visit: Payer: Self-pay

## 2020-12-13 DIAGNOSIS — R1314 Dysphagia, pharyngoesophageal phase: Secondary | ICD-10-CM | POA: Diagnosis not present

## 2020-12-13 DIAGNOSIS — K449 Diaphragmatic hernia without obstruction or gangrene: Secondary | ICD-10-CM | POA: Insufficient documentation

## 2020-12-13 DIAGNOSIS — D127 Benign neoplasm of rectosigmoid junction: Secondary | ICD-10-CM | POA: Insufficient documentation

## 2020-12-13 DIAGNOSIS — K2100 Gastro-esophageal reflux disease with esophagitis, without bleeding: Secondary | ICD-10-CM

## 2020-12-13 DIAGNOSIS — D124 Benign neoplasm of descending colon: Secondary | ICD-10-CM | POA: Insufficient documentation

## 2020-12-13 DIAGNOSIS — R1319 Other dysphagia: Secondary | ICD-10-CM

## 2020-12-13 DIAGNOSIS — Z1211 Encounter for screening for malignant neoplasm of colon: Secondary | ICD-10-CM | POA: Insufficient documentation

## 2020-12-13 DIAGNOSIS — K200 Eosinophilic esophagitis: Secondary | ICD-10-CM | POA: Diagnosis not present

## 2020-12-13 DIAGNOSIS — K621 Rectal polyp: Secondary | ICD-10-CM | POA: Insufficient documentation

## 2020-12-13 DIAGNOSIS — D126 Benign neoplasm of colon, unspecified: Secondary | ICD-10-CM | POA: Diagnosis not present

## 2020-12-13 DIAGNOSIS — K209 Esophagitis, unspecified without bleeding: Secondary | ICD-10-CM | POA: Diagnosis not present

## 2020-12-13 DIAGNOSIS — K222 Esophageal obstruction: Secondary | ICD-10-CM

## 2020-12-13 DIAGNOSIS — K635 Polyp of colon: Secondary | ICD-10-CM

## 2020-12-13 HISTORY — PX: COLONOSCOPY WITH PROPOFOL: SHX5780

## 2020-12-13 HISTORY — PX: ESOPHAGOGASTRODUODENOSCOPY: SHX5428

## 2020-12-13 SURGERY — COLONOSCOPY WITH PROPOFOL
Anesthesia: General

## 2020-12-13 MED ORDER — DEXMEDETOMIDINE HCL IN NACL 400 MCG/100ML IV SOLN
INTRAVENOUS | Status: DC | PRN
  Administered 2020-12-13: 8 ug via INTRAVENOUS

## 2020-12-13 MED ORDER — PROPOFOL 500 MG/50ML IV EMUL
INTRAVENOUS | Status: AC
  Filled 2020-12-13: qty 50

## 2020-12-13 MED ORDER — OMEPRAZOLE 40 MG PO CPDR: 40.0000 mg | DELAYED_RELEASE_CAPSULE | Freq: Two times a day (BID) | ORAL | 3 refills | Status: DC

## 2020-12-13 MED ORDER — GLYCOPYRROLATE 0.2 MG/ML IJ SOLN
INTRAMUSCULAR | Status: DC | PRN
  Administered 2020-12-13: .2 mg via INTRAVENOUS

## 2020-12-13 MED ORDER — PROPOFOL 500 MG/50ML IV EMUL
INTRAVENOUS | Status: DC | PRN
  Administered 2020-12-13: 125 ug/kg/min via INTRAVENOUS

## 2020-12-13 MED ORDER — PROPOFOL 10 MG/ML IV BOLUS
INTRAVENOUS | Status: DC | PRN
  Administered 2020-12-13: 40 mg via INTRAVENOUS
  Administered 2020-12-13 (×2): 100 mg via INTRAVENOUS
  Administered 2020-12-13: 60 mg via INTRAVENOUS

## 2020-12-13 MED ORDER — SODIUM CHLORIDE 0.9 % IV SOLN: INTRAVENOUS | Status: DC

## 2020-12-13 MED ORDER — LIDOCAINE HCL (CARDIAC) PF 100 MG/5ML IV SOSY
PREFILLED_SYRINGE | INTRAVENOUS | Status: DC | PRN
  Administered 2020-12-13: 80 mg via INTRAVENOUS
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
## 2020-12-13 NOTE — Anesthesia Postprocedure Evaluation (Signed)
Anesthesia Post Note  Patient: Brianna Wolf  Procedure(s) Performed: COLONOSCOPY WITH PROPOFOL ESOPHAGOGASTRODUODENOSCOPY (EGD)  Patient location during evaluation: PACU Anesthesia Type: General Level of consciousness: awake and alert Pain management: pain level controlled Vital Signs Assessment: post-procedure vital signs reviewed and stable Respiratory status: spontaneous breathing, nonlabored ventilation, respiratory function stable and patient connected to nasal cannula oxygen Cardiovascular status: blood pressure returned to baseline and stable Postop Assessment: no apparent nausea or vomiting Anesthetic complications: no   No notable events documented.   Last Vitals:  Vitals:   12/13/20 0806 12/13/20 0910  BP: (!) 147/68 (!) 102/48  Pulse: (!) 55   Resp: 15 13  Temp: (!) 36.4 C (!) 36.1 C  SpO2: 99% 99%    Last Pain:  Vitals:   12/13/20 0910  TempSrc: Temporal  PainSc:                  Molli Barrows

## 2020-12-13 NOTE — Transfer of Care (Addendum)
Immediate Anesthesia Transfer of Care Note  Patient: Brianna Wolf  Procedure(s) Performed: COLONOSCOPY WITH PROPOFOL ESOPHAGOGASTRODUODENOSCOPY (EGD)  Patient Location: PACU  Anesthesia Type:General  Level of Consciousness: drowsy  Airway & Oxygen Therapy: Patient Spontanous Breathing  Post-op Assessment: Report given to RN  Post vital signs: stable  Last Vitals:  Vitals Value Taken Time  BP    Temp    Pulse    Resp    SpO2      Last Pain:  Vitals:   12/13/20 0806  TempSrc: Temporal  PainSc: 0-No pain         Complications: No notable events documented.
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
## 2020-12-13 NOTE — Op Note (Signed)
[facility] Patient Name: Brianna Wolf Procedure Date: 12/13/2020 8:23 AM MRN: [id number] Account #: [id number] Date of Birth: 1965-10-26 Admit Type: Outpatient Age: 55 Room: [facility] ENDO ROOM 3 Gender: Female Note Status: Finalized Instrument Name: Jasper Riling [id number] Procedure:             Colonoscopy Indications:           High risk colon cancer surveillance: Personal history                         of sessile serrated colon polyp (less than 10 mm in                         size) with no dysplasia Providers:             Lin Landsman MD, MD Referring MD:          Stephani Police. Stann Mainland (Referring MD) Medicines:             General Anesthesia Complications:         No immediate complications. Estimated blood loss: None. Procedure:             Pre-Anesthesia Assessment:                        - Prior to the procedure, a History and Physical was                         performed, and patient medications and allergies were                         reviewed. The patient is competent. The risks and                         benefits of the procedure and the sedation options and                         risks were discussed with the patient. All questions                         were answered and informed consent was obtained.                         Patient identification and proposed procedure were                         verified by the physician, the nurse, the                         anesthesiologist, the anesthetist and the technician                         in the pre-procedure area in the procedure room in the                         endoscopy suite. Mental Status Examination: alert and                         oriented. Airway Examination: normal oropharyngeal  airway and neck mobility. Respiratory Examination:                         clear to auscultation. CV Examination: normal.                         Prophylactic  Antibiotics: The patient does not require                         prophylactic antibiotics. Prior Anticoagulants: The                         patient has taken no previous anticoagulant or                         antiplatelet agents. ASA Grade Assessment: II - A                         patient with mild systemic disease. After reviewing                         the risks and benefits, the patient was deemed in                         satisfactory condition to undergo the procedure. The                         anesthesia plan was to use general anesthesia.                         Immediately prior to administration of medications,                         the patient was re-assessed for adequacy to receive                         sedatives. The heart rate, respiratory rate, oxygen                         saturations, blood pressure, adequacy of pulmonary                         ventilation, and response to care were monitored                         throughout the procedure. The physical status of the                         patient was re-assessed after the procedure.                        After obtaining informed consent, the colonoscope was                         passed under direct vision. Throughout the procedure,                         the patient's blood pressure, pulse, and oxygen  saturations were monitored continuously. The                         Colonoscope was introduced through the anus and                         advanced to the the cecum, identified by appendiceal                         orifice and ileocecal valve. The colonoscopy was                         performed without difficulty. The patient tolerated                         the procedure well. The quality of the bowel                         preparation was evaluated using the BBPS [facility] Bowel                         Preparation Scale) with scores of: Right Colon = 2                          (minor amount of residual staining, small fragments of                         stool and/or opaque liquid, but mucosa seen well),                         Transverse Colon = 2 (minor amount of residual                         staining, small fragments of stool and/or opaque                         liquid, but mucosa seen well) and Left Colon = 2                         (minor amount of residual staining, small fragments of                         stool and/or opaque liquid, but mucosa seen well). The                         total BBPS score equals 6. Findings:      The perianal and digital rectal examinations were normal. Pertinent       negatives include normal sphincter tone and no palpable rectal lesions.      Six sessile polyps were found in the rectum 1, recto-sigmoid colon 3 and       descending colon 2. The polyps were 4 to 6 mm in size. These polyps were       removed with a cold snare. Resection and retrieval were complete.       Estimated blood loss: none.      The retroflexed view of the distal rectum and anal verge was normal and  showed no anal or rectal abnormalities. Impression:            - Six 4 to 6 mm polyps in the rectum, at the                         recto-sigmoid colon and in the descending colon,                         removed with a cold snare. Resected and retrieved.                        - The distal rectum and anal verge are normal on                         retroflexion view. Recommendation:        - Discharge patient to home (with escort).                        - Resume previous diet today.                        - Continue present medications.                        - Await pathology results.                        - Repeat colonoscopy in 3 years for surveillance of                         multiple polyps. Procedure Code(s):     --- Professional ---                        [phone], Colonoscopy, flexible; with removal of                          tumor(s), polyp(s), or other lesion(s) by snare                         technique Diagnosis Code(s):     --- Professional ---                        Z86.010, Personal history of colonic polyps                        K62.1, Rectal polyp                        K63.5, Polyp of colon CPT copyright 2019 American Medical Association. All rights reserved. The codes documented in this report are preliminary and upon coder review may  be revised to meet current compliance requirements. Dr. Ulyess Mort Lin Landsman MD, MD 12/13/2020 9:16:17 AM This report has been signed electronically. Number of Addenda: 0 Note Initiated On: 12/13/2020 8:23 AM Scope Withdrawal Time: 0 hours 14 minutes 40 seconds  Total Procedure Duration: 0 hours 18 minutes 13 seconds  Estimated Blood Loss:  Estimated blood loss: none.      [facility]

## 2020-12-13 NOTE — Op Note (Signed)
[facility] Patient Name: Brianna Wolf Procedure Date: 12/13/2020 8:24 AM MRN: [id number] Account #: [id number] Date of Birth: [birth year] Admit Type: Outpatient Age: 55 Room: [facility] ENDO ROOM 3 Gender: Female Note Status: Finalized Instrument Name: Upper Endoscope [id number] Procedure:             Upper GI endoscopy Indications:           Esophageal dysphagia Providers:             Lin Landsman MD, MD Referring MD:          Stephani Police. Stann Mainland (Referring MD) Medicines:             General Anesthesia Complications:         No immediate complications. Estimated blood loss:                         Minimal. Procedure:             Pre-Anesthesia Assessment:                        - Prior to the procedure, a History and Physical was                         performed, and patient medications and allergies were                         reviewed. The patient is competent. The risks and                         benefits of the procedure and the sedation options and                         risks were discussed with the patient. All questions                         were answered and informed consent was obtained.                         Patient identification and proposed procedure were                         verified by the physician, the nurse, the                         anesthesiologist, the anesthetist and the technician                         in the pre-procedure area in the procedure room in the                         endoscopy suite. Mental Status Examination: alert and                         oriented. Airway Examination: normal oropharyngeal                         airway and neck mobility. Respiratory Examination:  clear to auscultation. CV Examination: normal.                         Prophylactic Antibiotics: The patient does not require                         prophylactic antibiotics. Prior Anticoagulants: The                          patient has taken no previous anticoagulant or                         antiplatelet agents. ASA Grade Assessment: II - A                         patient with mild systemic disease. After reviewing                         the risks and benefits, the patient was deemed in                         satisfactory condition to undergo the procedure. The                         anesthesia plan was to use general anesthesia.                         Immediately prior to administration of medications,                         the patient was re-assessed for adequacy to receive                         sedatives. The heart rate, respiratory rate, oxygen                         saturations, blood pressure, adequacy of pulmonary                         ventilation, and response to care were monitored                         throughout the procedure. The physical status of the                         patient was re-assessed after the procedure.                        After obtaining informed consent, the endoscope was                         passed under direct vision. Throughout the procedure,                         the patient's blood pressure, pulse, and oxygen                         saturations were monitored continuously. The  Endosonoscope was introduced through the mouth, and                         advanced to the second part of duodenum. The upper GI                         endoscopy was accomplished without difficulty. The                         patient tolerated the procedure well. Findings:      One benign-appearing, intrinsic severe (stenosis; an endoscope cannot       pass) stenosis was found 33 cm from the incisors. This stenosis measured       less than one cm (in length). The stenosis was traversed after dilation.       A TTS dilator was passed through the scope. Dilation with an 08-23-08 mm       balloon dilator was performed to 10 mm. The dilation site  was examined       following endoscope reinsertion and showed moderate mucosal disruption       and moderate improvement in luminal narrowing. Estimated blood loss was       minimal.      Mucosal changes including congestion (edema), esophageal erosions,       stenosis and vertical lines were found in the entire esophagus.       Esophageal findings were graded using the Eosinophilic Esophagitis       Endoscopic Reference Score (EoE-EREFS) as: Edema Grade 1 Present       (decreased clarity or absence of vascular markings), Rings Grade 0 None       (no ridges or rings seen), Exudates Grade 0 None (no white lesions       seen), Furrows Grade 1 Present (vertical lines with or without visible       depth) and Stricture present. Biopsies were taken with a cold forceps       for histology.      A 1 cm hiatal hernia was present.      The entire examined stomach was normal.      The cardia and gastric fundus were normal on retroflexion.      The duodenal bulb and second portion of the duodenum were normal. Impression:            - Benign-appearing esophageal stenosis. Dilated.                        - Esophageal mucosal changes suggestive of                         eosinophilic esophagitis. Biopsied.                        - 1 cm hiatal hernia.                        - Normal stomach.                        - Normal duodenal bulb and second portion of the                         duodenum. Recommendation:        -  Await pathology results.                        - Follow an antireflux regimen.                        - Use Prilosec (omeprazole) 40 mg PO BID for 3 months.                        - Repeat upper endoscopy in 1 month for retreatment.                        - Return to GI office as previously scheduled. Procedure Code(s):     --- Professional ---                        [phone], Esophagogastroduodenoscopy, flexible,                         transoral; with transendoscopic balloon dilation  of                         esophagus (less than 30 mm diameter)                        [id number], 59, Esophagogastroduodenoscopy, flexible,                         transoral; with biopsy, single or multiple Diagnosis Code(s):     --- Professional ---                        K22.2, Esophageal obstruction                        K22.8, Other specified diseases of esophagus                        K44.9, Diaphragmatic hernia without obstruction or                         gangrene                        R13.14, Dysphagia, pharyngoesophageal phase CPT copyright 2019 American Medical Association. All rights reserved. The codes documented in this report are preliminary and upon coder review may  be revised to meet current compliance requirements. Dr. Ulyess Mort Lin Landsman MD, MD 12/13/2020 8:53:34 AM This report has been signed electronically. Number of Addenda: 0 Note Initiated On: 12/13/2020 8:24 AM Estimated Blood Loss:  Estimated blood loss was minimal.      [facility]

## 2020-12-14 ENCOUNTER — Encounter: Payer: Self-pay | Admitting: Gastroenterology

## 2020-12-14 LAB — SURGICAL PATHOLOGY

## 2020-12-15 ENCOUNTER — Telehealth: Payer: Self-pay

## 2020-12-15 NOTE — Telephone Encounter (Signed)
Called patient about her results she already has appointment to come in and talk about her diagnoses

## 2020-12-20 DIAGNOSIS — R5383 Other fatigue: Secondary | ICD-10-CM | POA: Diagnosis not present

## 2020-12-20 DIAGNOSIS — E282 Polycystic ovarian syndrome: Secondary | ICD-10-CM | POA: Diagnosis not present

## 2020-12-28 ENCOUNTER — Encounter: Payer: Self-pay | Admitting: Gastroenterology

## 2020-12-28 ENCOUNTER — Ambulatory Visit (INDEPENDENT_AMBULATORY_CARE_PROVIDER_SITE_OTHER): Payer: BC Managed Care – PPO | Admitting: Gastroenterology

## 2020-12-28 VITALS — BP 137/78 | HR 72 | Temp 99.0°F | Ht 63.0 in | Wt 158.2 lb

## 2020-12-28 DIAGNOSIS — K200 Eosinophilic esophagitis: Secondary | ICD-10-CM | POA: Diagnosis not present

## 2020-12-28 DIAGNOSIS — K222 Esophageal obstruction: Secondary | ICD-10-CM | POA: Diagnosis not present

## 2020-12-28 DIAGNOSIS — K635 Polyp of colon: Secondary | ICD-10-CM
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
## 2021-01-02 ENCOUNTER — Telehealth: Payer: Self-pay | Admitting: Gastroenterology

## 2021-01-02 NOTE — Telephone Encounter (Signed)
Patient wants to reschedule procedure. Clinical staff will follow up with patient.

## 2021-01-03 ENCOUNTER — Telehealth: Payer: Self-pay | Admitting: Gastroenterology

## 2021-01-03 NOTE — Telephone Encounter (Signed)
Inbound call from pt requesting a call back to r/s her procedure. Please advise. Thank you.

## 2021-01-04 ENCOUNTER — Telehealth: Payer: Self-pay

## 2021-01-04 NOTE — Telephone Encounter (Signed)
Called got patient rescheduled to 02/14/2021 Called endo and sent new referral to nicole

## 2021-01-21 ENCOUNTER — Other Ambulatory Visit: Payer: Self-pay | Admitting: Obstetrics and Gynecology

## 2021-01-21 DIAGNOSIS — F419 Anxiety disorder, unspecified: Secondary | ICD-10-CM

## 2021-01-26 DIAGNOSIS — N959 Unspecified menopausal and perimenopausal disorder: Secondary | ICD-10-CM | POA: Diagnosis not present

## 2021-01-26 DIAGNOSIS — N951 Menopausal and female climacteric states: Secondary | ICD-10-CM | POA: Diagnosis not present

## 2021-01-26 DIAGNOSIS — N912 Amenorrhea, unspecified: Secondary | ICD-10-CM | POA: Diagnosis not present

## 2021-02-07 DIAGNOSIS — E7800 Pure hypercholesterolemia, unspecified: Secondary | ICD-10-CM | POA: Diagnosis not present

## 2021-02-07 DIAGNOSIS — D519 Vitamin B12 deficiency anemia, unspecified: Secondary | ICD-10-CM | POA: Diagnosis not present

## 2021-02-07 DIAGNOSIS — E559 Vitamin D deficiency, unspecified: Secondary | ICD-10-CM | POA: Diagnosis not present

## 2021-02-14 ENCOUNTER — Encounter: Payer: Self-pay | Admitting: Gastroenterology

## 2021-02-14 ENCOUNTER — Encounter
Admission: RE | Disposition: A | Discharge status: Home / Self Care | Payer: Self-pay | Source: Home / Self Care | Attending: Gastroenterology

## 2021-02-14 ENCOUNTER — Ambulatory Visit: Payer: BC Managed Care – PPO | Admitting: Anesthesiology

## 2021-02-14 ENCOUNTER — Ambulatory Visit
Admission: RE | Admit: 2021-02-14 | Discharge: 2021-02-14 | Disposition: A | Discharge status: Home / Self Care | Payer: BC Managed Care – PPO | Attending: Gastroenterology | Admitting: Gastroenterology

## 2021-02-14 DIAGNOSIS — R1310 Dysphagia, unspecified: Secondary | ICD-10-CM | POA: Insufficient documentation

## 2021-02-14 DIAGNOSIS — Z79899 Other long term (current) drug therapy: Secondary | ICD-10-CM | POA: Insufficient documentation

## 2021-02-14 DIAGNOSIS — K222 Esophageal obstruction: Secondary | ICD-10-CM | POA: Insufficient documentation

## 2021-02-14 DIAGNOSIS — K200 Eosinophilic esophagitis: Secondary | ICD-10-CM | POA: Diagnosis not present

## 2021-02-14 DIAGNOSIS — R1319 Other dysphagia: Secondary | ICD-10-CM | POA: Diagnosis not present

## 2021-02-14 DIAGNOSIS — K2980 Duodenitis without bleeding: Secondary | ICD-10-CM | POA: Diagnosis not present

## 2021-02-14 DIAGNOSIS — F32A Depression, unspecified: Secondary | ICD-10-CM | POA: Insufficient documentation

## 2021-02-14 DIAGNOSIS — K2970 Gastritis, unspecified, without bleeding: Secondary | ICD-10-CM | POA: Diagnosis not present

## 2021-02-14 HISTORY — PX: ESOPHAGOGASTRODUODENOSCOPY (EGD) WITH PROPOFOL: SHX5813

## 2021-02-14 SURGERY — ESOPHAGOGASTRODUODENOSCOPY (EGD) WITH PROPOFOL
Anesthesia: General

## 2021-02-14 MED ORDER — LIDOCAINE HCL (CARDIAC) PF 100 MG/5ML IV SOSY
PREFILLED_SYRINGE | INTRAVENOUS | Status: DC | PRN
  Administered 2021-02-14: 40 mg via INTRAVENOUS

## 2021-02-14 MED ORDER — PROPOFOL 500 MG/50ML IV EMUL
INTRAVENOUS | Status: DC | PRN
  Administered 2021-02-14: 150 ug/kg/min via INTRAVENOUS

## 2021-02-14 MED ORDER — PROPOFOL 10 MG/ML IV BOLUS
INTRAVENOUS | Status: DC | PRN
  Administered 2021-02-14: 80 mg via INTRAVENOUS
  Administered 2021-02-14 (×2): 10 mg via INTRAVENOUS

## 2021-02-14 MED ORDER — SODIUM CHLORIDE 0.9 % IV SOLN: INTRAVENOUS | Status: DC

## 2021-02-14 MED ORDER — PROPOFOL 500 MG/50ML IV EMUL
INTRAVENOUS | Status: AC
  Filled 2021-02-14: qty 50

## 2021-02-14 NOTE — Transfer of Care (Signed)
Immediate Anesthesia Transfer of Care Note  Patient: Brianna Wolf  Procedure(s) Performed: Procedure(s): ESOPHAGOGASTRODUODENOSCOPY (EGD) WITH PROPOFOL (N/A)  Patient Location: PACU and Endoscopy Unit  Anesthesia Type:General  Level of Consciousness: sedated  Airway & Oxygen Therapy: Patient Spontanous Breathing and Patient connected to nasal cannula oxygen  Post-op Assessment: Report given to RN and Post -op Vital signs reviewed and stable  Post vital signs: Reviewed and stable  Last Vitals:  Vitals:   02/14/21 1114 02/14/21 1418  BP: (!) 147/81 (!) 99/57  Pulse: 66   Resp: 16   Temp: (!) 35.6 C   SpO2: 559% 741%    Complications: No apparent anesthesia complications
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
## 2021-02-14 NOTE — Anesthesia Procedure Notes (Signed)
Date/Time: 02/14/2021 2:00 PM Performed by: Doreen Salvage, CRNA Pre-anesthesia Checklist: Patient identified, Emergency Drugs available, Suction available and Patient being monitored Patient Re-evaluated:Patient Re-evaluated prior to induction Oxygen Delivery Method: Nasal cannula Induction Type: IV induction Dental Injury: Teeth and Oropharynx as per pre-operative assessment  Comments: Nasal cannula with etCO2 monitoring
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
## 2021-02-14 NOTE — Op Note (Signed)
[facility] Gastroenterology Patient Name: Brianna Wolf Procedure Date: 02/14/2021 1:34 PM MRN: [id number] Account #: [id number] Date of Birth: [birth year] Admit Type: Outpatient Age: 56 Room: [facility] ENDO ROOM 3 Gender: Female Note Status: Finalized Instrument Name: Upper Endoscope [id number] Procedure:             Upper GI endoscopy Indications:           Dysphagia, Eosinophilic esophagitis Providers:             Lucilla Lame MD, MD Referring MD:          Stephani Police. Stann Mainland (Referring MD) Medicines:             Propofol per Anesthesia Complications:         No immediate complications. Procedure:             Pre-Anesthesia Assessment:                        - Prior to the procedure, a History and Physical was                         performed, and patient medications and allergies were                         reviewed. The patient's tolerance of previous                         anesthesia was also reviewed. The risks and benefits                         of the procedure and the sedation options and risks                         were discussed with the patient. All questions were                         answered, and informed consent was obtained. Prior                         Anticoagulants: The patient has taken no previous                         anticoagulant or antiplatelet agents. ASA Grade                         Assessment: II - A patient with mild systemic disease.                         After reviewing the risks and benefits, the patient                         was deemed in satisfactory condition to undergo the                         procedure.                        After obtaining informed consent, the endoscope was  passed under direct vision. Throughout the procedure,                         the patient's blood pressure, pulse, and oxygen                         saturations were monitored continuously. The Endoscope                          was introduced through the mouth, and advanced to the                         second part of duodenum. The upper GI endoscopy was                         accomplished without difficulty. The patient tolerated                         the procedure well. Findings:      Multiple benign-appearing, intrinsic moderate (circumferential scarring       or stenosis; an endoscope may pass) stenoses were found in the entire       esophagus. The stenoses were traversed. A TTS dilator was passed through       the scope. Dilation with a 12-13.5-15 mm balloon dilator was performed       to 15 mm. The dilation site was examined following endoscope reinsertion       and showed moderate improvement in luminal narrowing.      The entire examined stomach was normal. Biopsies were taken with a cold       forceps for histology.      The examined duodenum was normal. Biopsies were taken with a cold       forceps for histology. Impression:            - Benign-appearing esophageal stenoses. Dilated.                        - Normal stomach. Biopsied.                        - Normal examined duodenum. Biopsied. Recommendation:        - Discharge patient to home.                        - Resume previous diet.                        - Continue present medications.                        - Await pathology results. Procedure Code(s):     --- Professional ---                        [phone], Esophagogastroduodenoscopy, flexible,                         transoral; with transendoscopic balloon dilation of                         esophagus (less than 30 mm diameter)  59163, 59, Esophagogastroduodenoscopy, flexible,                         transoral; with biopsy, single or multiple Diagnosis Code(s):     --- Professional ---                        R13.10, Dysphagia, unspecified                        W46.6, Eosinophilic esophagitis                        K22.2, Esophageal obstruction CPT  copyright 2019 American Medical Association. All rights reserved. The codes documented in this report are preliminary and upon coder review may  be revised to meet current compliance requirements. Lucilla Lame MD, MD 02/14/2021 2:14:39 PM This report has been signed electronically. Number of Addenda: 0 Note Initiated On: 02/14/2021 1:34 PM Estimated Blood Loss:  Estimated blood loss: none.      [facility]

## 2021-02-15 ENCOUNTER — Encounter: Payer: Self-pay | Admitting: Gastroenterology

## 2021-02-15 NOTE — Anesthesia Postprocedure Evaluation (Signed)
Anesthesia Post Note  Patient: Brianna Wolf  Procedure(s) Performed: ESOPHAGOGASTRODUODENOSCOPY (EGD) WITH PROPOFOL  Patient location during evaluation: Endoscopy Anesthesia Type: General Level of consciousness: awake and alert Pain management: pain level controlled Vital Signs Assessment: post-procedure vital signs reviewed and stable Respiratory status: spontaneous breathing, nonlabored ventilation, respiratory function stable and patient connected to nasal cannula oxygen Cardiovascular status: blood pressure returned to baseline and stable Postop Assessment: no apparent nausea or vomiting Anesthetic complications: no   No notable events documented.   Last Vitals:  Vitals:   02/14/21 1428 02/14/21 1438  BP: 117/83 127/71  Pulse:    Resp:    Temp:    SpO2:      Last Pain:  Vitals:   02/14/21 1438  TempSrc:   PainSc: 0-No pain                 Precious Haws Ruffus Kamaka

## 2021-02-16 ENCOUNTER — Encounter: Payer: Self-pay | Admitting: Gastroenterology

## 2021-02-16 LAB — SURGICAL PATHOLOGY

## 2021-02-17 ENCOUNTER — Encounter: Payer: Self-pay | Admitting: Gastroenterology

## 2021-03-03 ENCOUNTER — Ambulatory Visit
Admission: RE | Admit: 2021-03-03 | Discharge: 2021-03-03 | Disposition: A | Discharge status: Home / Self Care | Payer: BC Managed Care – PPO | Source: Ambulatory Visit | Attending: Family Medicine | Admitting: Family Medicine

## 2021-03-03 ENCOUNTER — Other Ambulatory Visit: Payer: Self-pay

## 2021-03-03 VITALS — BP 134/80 | HR 71 | Temp 98.7°F | Resp 16

## 2021-03-03 DIAGNOSIS — J209 Acute bronchitis, unspecified: Secondary | ICD-10-CM | POA: Diagnosis not present

## 2021-03-03 DIAGNOSIS — J019 Acute sinusitis, unspecified: Secondary | ICD-10-CM | POA: Diagnosis not present

## 2021-03-03 MED ORDER — PREDNISONE 20 MG PO TABS: 40.0000 mg | ORAL_TABLET | Freq: Every day | ORAL | 0 refills | Status: DC

## 2021-03-03 MED ORDER — BENZONATATE 200 MG PO CAPS: 200.0000 mg | ORAL_CAPSULE | Freq: Three times a day (TID) | ORAL | 0 refills | Status: DC | PRN

## 2021-03-03 MED ORDER — DOXYCYCLINE HYCLATE 100 MG PO CAPS: 100.0000 mg | ORAL_CAPSULE | Freq: Two times a day (BID) | ORAL | 0 refills | Status: AC

## 2021-03-03 MED ORDER — ALBUTEROL SULFATE HFA 108 (90 BASE) MCG/ACT IN AERS: 1.0000 | INHALATION_SPRAY | Freq: Four times a day (QID) | RESPIRATORY_TRACT | 0 refills | Status: DC | PRN

## 2021-03-03 NOTE — ED Triage Notes (Signed)
Pt experiencing a persistent productive cough x 10 days after having the flu.
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
## 2021-04-18 DIAGNOSIS — Z79890 Hormone replacement therapy (postmenopausal): Secondary | ICD-10-CM | POA: Diagnosis not present

## 2021-04-18 DIAGNOSIS — E282 Polycystic ovarian syndrome: Secondary | ICD-10-CM | POA: Diagnosis not present

## 2021-04-18 DIAGNOSIS — R7989 Other specified abnormal findings of blood chemistry: Secondary | ICD-10-CM | POA: Diagnosis not present

## 2021-05-01 ENCOUNTER — Ambulatory Visit: Payer: BC Managed Care – PPO | Admitting: Gastroenterology

## 2021-06-06 DIAGNOSIS — N951 Menopausal and female climacteric states: Secondary | ICD-10-CM | POA: Diagnosis not present

## 2021-06-06 DIAGNOSIS — Z79890 Hormone replacement therapy (postmenopausal): Secondary | ICD-10-CM | POA: Diagnosis not present

## 2021-07-13 ENCOUNTER — Encounter: Payer: Self-pay | Admitting: Gastroenterology

## 2021-07-13 ENCOUNTER — Ambulatory Visit: Payer: BC Managed Care – PPO | Admitting: Gastroenterology

## 2021-07-13 VITALS — BP 149/80 | HR 60 | Temp 98.3°F | Ht 63.0 in | Wt 160.0 lb

## 2021-07-13 DIAGNOSIS — K200 Eosinophilic esophagitis: Secondary | ICD-10-CM | POA: Diagnosis not present
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
## 2021-08-23 DIAGNOSIS — N912 Amenorrhea, unspecified: Secondary | ICD-10-CM | POA: Diagnosis not present

## 2021-08-23 DIAGNOSIS — E282 Polycystic ovarian syndrome: Secondary | ICD-10-CM | POA: Diagnosis not present

## 2021-08-23 DIAGNOSIS — E349 Endocrine disorder, unspecified: Secondary | ICD-10-CM | POA: Diagnosis not present

## 2021-09-20 DIAGNOSIS — Z23 Encounter for immunization: Secondary | ICD-10-CM | POA: Diagnosis not present

## 2021-10-26 ENCOUNTER — Other Ambulatory Visit: Payer: Self-pay | Admitting: Adult Health

## 2021-10-26 DIAGNOSIS — Z1231 Encounter for screening mammogram for malignant neoplasm of breast: Secondary | ICD-10-CM

## 2021-11-30 ENCOUNTER — Ambulatory Visit
Admission: RE | Admit: 2021-11-30 | Discharge: 2021-11-30 | Disposition: A | Discharge status: Home / Self Care | Payer: BC Managed Care – PPO | Source: Ambulatory Visit | Attending: Adult Health | Admitting: Adult Health

## 2021-11-30 DIAGNOSIS — Z1231 Encounter for screening mammogram for malignant neoplasm of breast: Secondary | ICD-10-CM | POA: Diagnosis not present

## 2021-12-21 DIAGNOSIS — E282 Polycystic ovarian syndrome: Secondary | ICD-10-CM | POA: Diagnosis not present

## 2021-12-21 DIAGNOSIS — R5383 Other fatigue: Secondary | ICD-10-CM | POA: Diagnosis not present

## 2021-12-21 DIAGNOSIS — R5381 Other malaise: Secondary | ICD-10-CM | POA: Diagnosis not present

## 2021-12-21 DIAGNOSIS — N912 Amenorrhea, unspecified: Secondary | ICD-10-CM | POA: Diagnosis not present

## 2022-01-10 ENCOUNTER — Ambulatory Visit: Payer: BC Managed Care – PPO | Admitting: Gastroenterology

## 2022-01-10 ENCOUNTER — Encounter: Payer: Self-pay | Admitting: Gastroenterology

## 2022-01-10 VITALS — BP 138/86 | HR 72 | Temp 98.4°F | Wt 159.0 lb

## 2022-01-10 DIAGNOSIS — K200 Eosinophilic esophagitis: Secondary | ICD-10-CM | POA: Diagnosis not present

## 2022-01-10 NOTE — Patient Instructions (Signed)
Blood pressure recheck normal, 138/86
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
## 2022-01-29 ENCOUNTER — Encounter: Payer: Self-pay | Admitting: Gastroenterology

## 2022-12-31 ENCOUNTER — Other Ambulatory Visit: Payer: Self-pay | Admitting: Family Medicine

## 2022-12-31 DIAGNOSIS — Z1231 Encounter for screening mammogram for malignant neoplasm of breast: Secondary | ICD-10-CM

## 2023-01-25 ENCOUNTER — Ambulatory Visit
Admission: RE | Admit: 2023-01-25 | Discharge: 2023-01-25 | Disposition: A | Discharge status: Home / Self Care | Payer: PRIVATE HEALTH INSURANCE | Source: Ambulatory Visit | Attending: Family Medicine | Admitting: Family Medicine

## 2023-01-25 DIAGNOSIS — Z1231 Encounter for screening mammogram for malignant neoplasm of breast: Secondary | ICD-10-CM | POA: Insufficient documentation

## 2023-01-28 ENCOUNTER — Encounter: Payer: Self-pay | Admitting: Family Medicine

## 2023-01-29 ENCOUNTER — Other Ambulatory Visit: Payer: Self-pay | Admitting: Family Medicine

## 2023-01-29 DIAGNOSIS — R928 Other abnormal and inconclusive findings on diagnostic imaging of breast: Secondary | ICD-10-CM

## 2023-01-30 ENCOUNTER — Other Ambulatory Visit: Payer: PRIVATE HEALTH INSURANCE

## 2023-02-04 ENCOUNTER — Ambulatory Visit
Admission: RE | Admit: 2023-02-04 | Discharge: 2023-02-04 | Disposition: A | Discharge status: Home / Self Care | Payer: PRIVATE HEALTH INSURANCE | Source: Ambulatory Visit | Attending: Family Medicine | Admitting: Family Medicine

## 2023-02-04 DIAGNOSIS — R928 Other abnormal and inconclusive findings on diagnostic imaging of breast: Secondary | ICD-10-CM | POA: Insufficient documentation

## 2023-03-06 IMAGING — MG MM DIGITAL DIAGNOSTIC UNILAT*R* W/ TOMO W/ CAD
5 series · 6 of 9 positions shown · non-contrast
Comparison: Previous exam(s).

CLINICAL DATA: Recall for calcifications in the right breast.

EXAM:
DIGITAL DIAGNOSTIC UNILATERAL RIGHT MAMMOGRAM WITH TOMOSYNTHESIS AND
CAD
TECHNIQUE: Right digital diagnostic mammography and breast tomosynthesis was
performed. The images were evaluated with computer-aided detection.

[R CC]
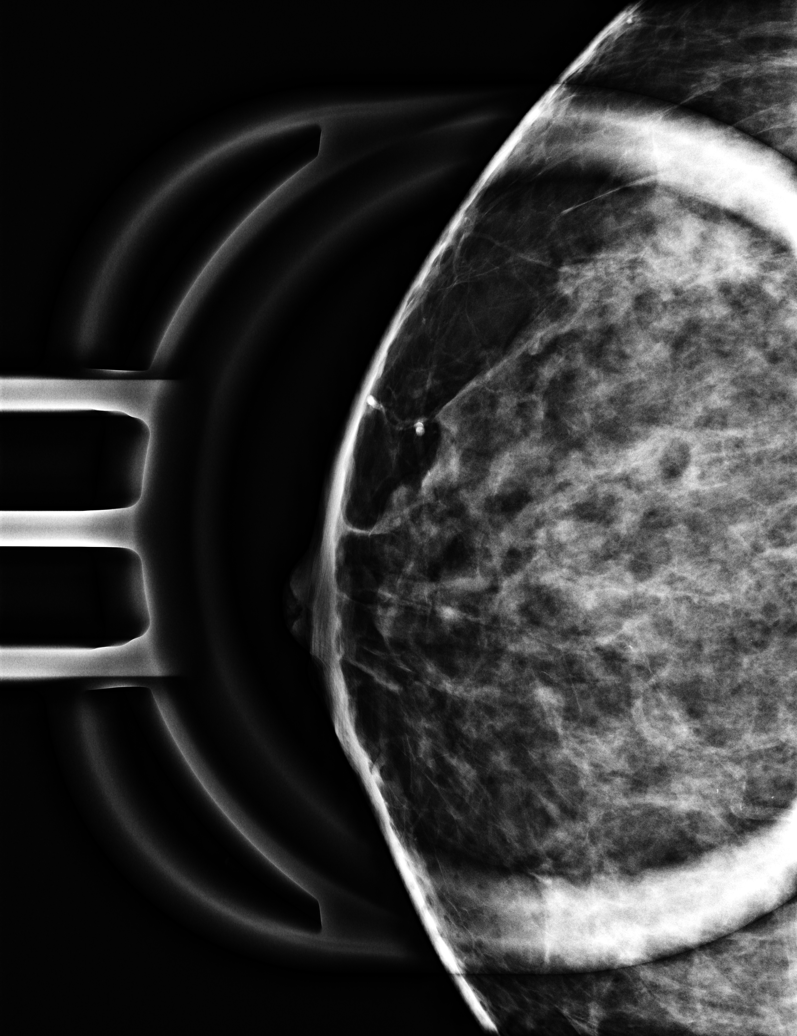

[R ML (1 of 2)]
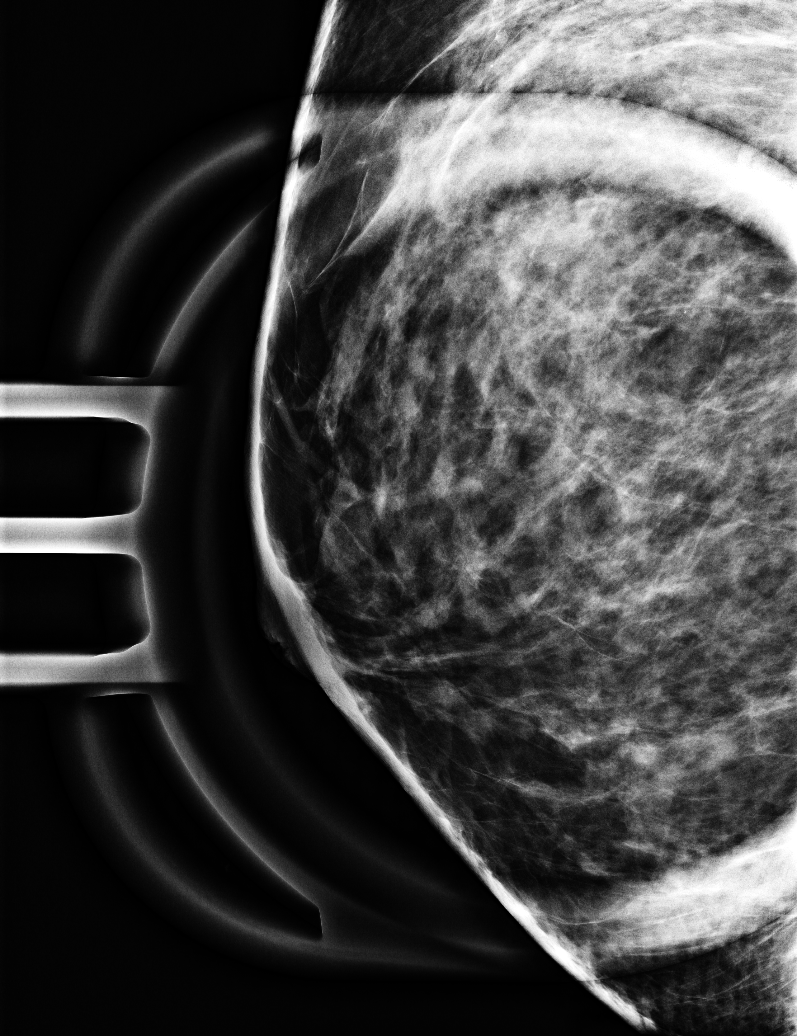

[R ML (2 of 2)]
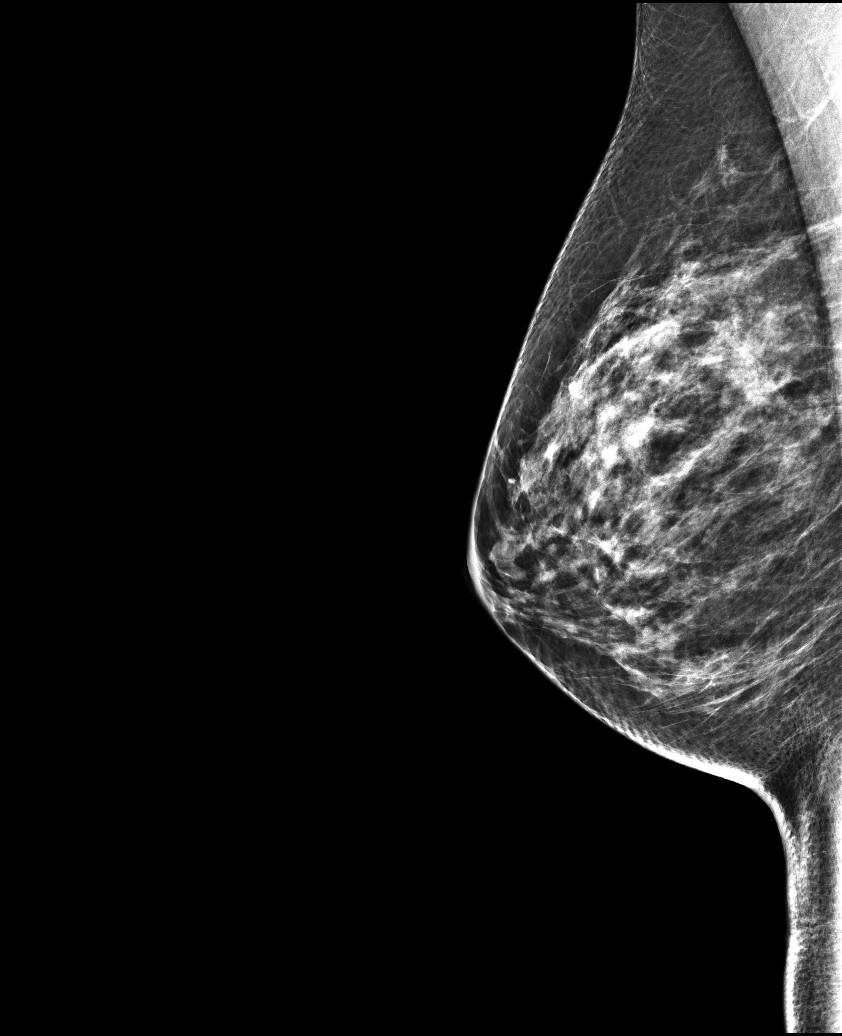

[R ML synth-2D]
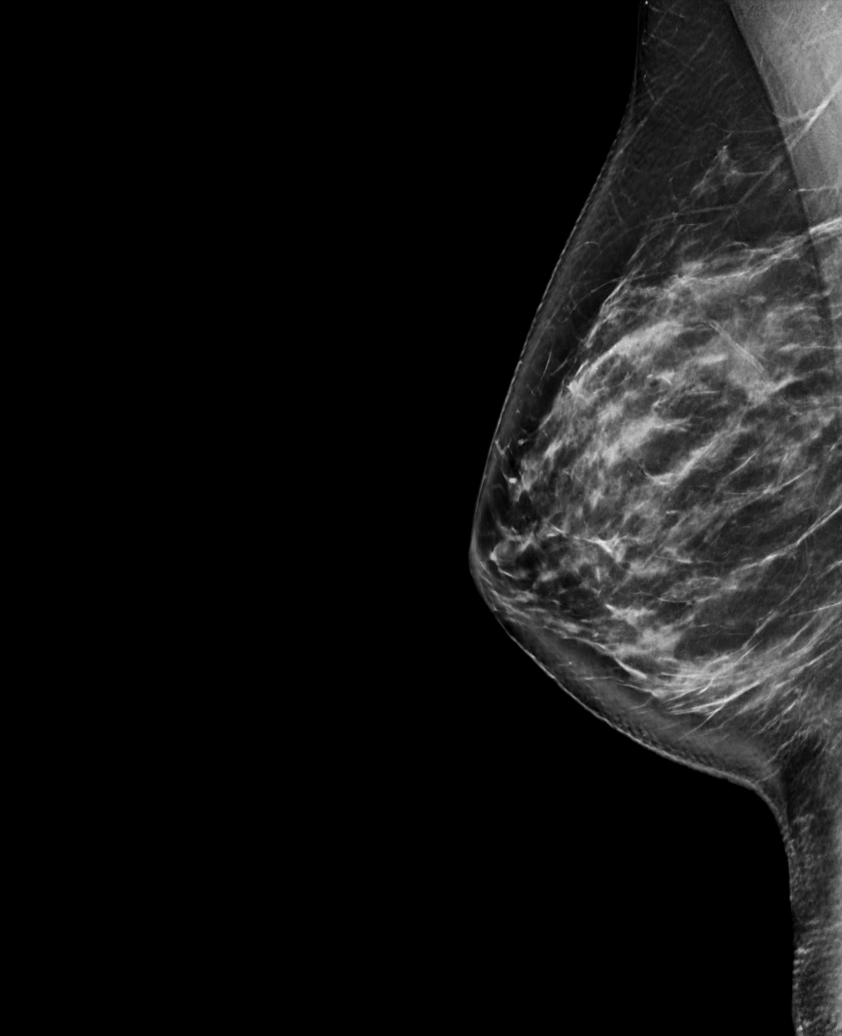

[R ML tomo · 2 of 94 frames shown]
[frame 31/94]
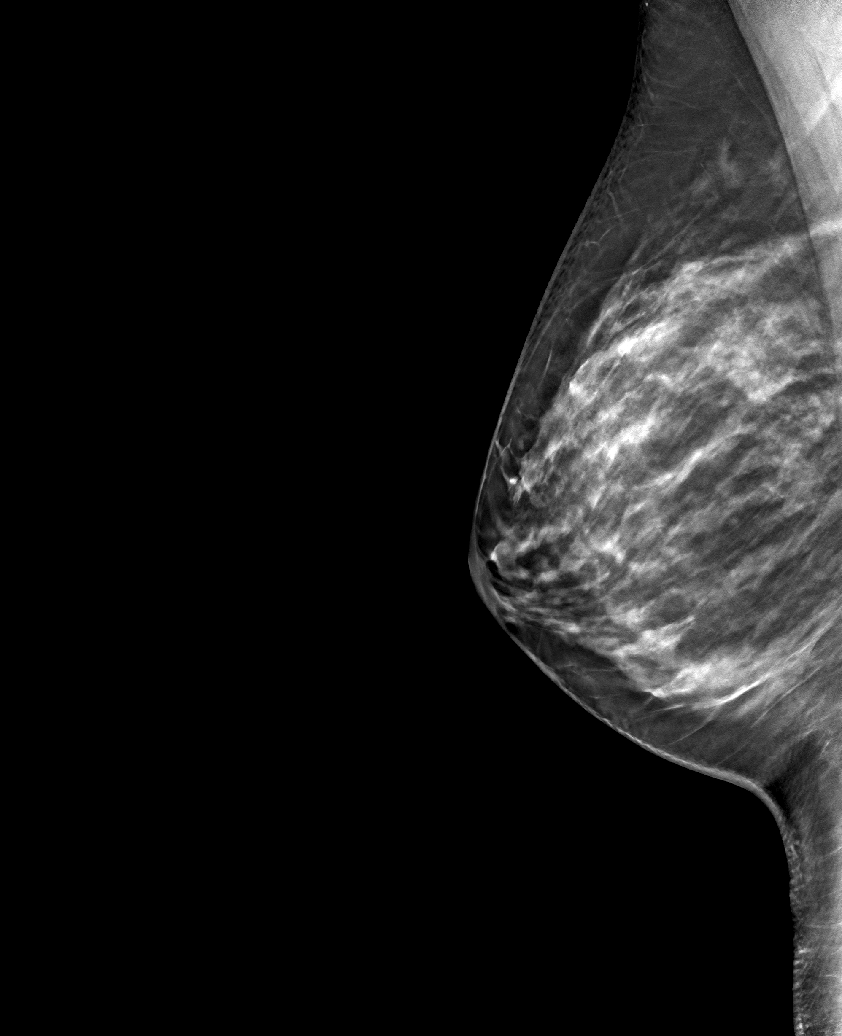
[frame 47/94]
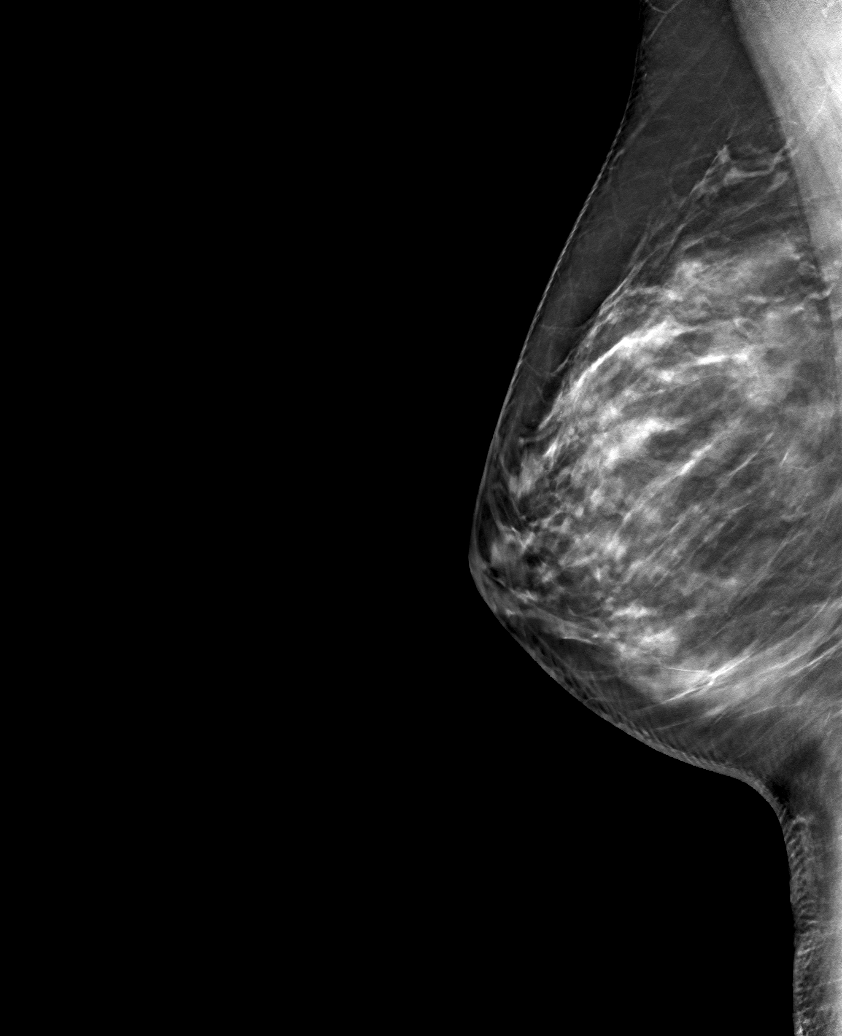

[6 of 9 positions shown; findings below may reference images not displayed]

ACR Breast Density Category c: The breast tissue is heterogeneously
dense, which may obscure small masses.
FINDINGS: The previously noted calcifications in the right breast correspond
with vascular calcifications and a few scattered punctate
calcifications in the slightly inner upper breast. No suspicious
findings on additional imaging.
IMPRESSION: Benign calcifications in the right breast.

RECOMMENDATION:
Annual screening mammogram

I have discussed the findings and recommendations with the patient.
If applicable, a reminder letter will be sent to the patient
regarding the next appointment.

BI-RADS CATEGORY  2: Benign.

## 2023-09-03 ENCOUNTER — Telehealth: Payer: Self-pay | Admitting: *Deleted

## 2023-09-03 ENCOUNTER — Other Ambulatory Visit: Payer: Self-pay | Admitting: *Deleted

## 2023-09-03 DIAGNOSIS — Z860100 Personal history of colon polyps, unspecified: Secondary | ICD-10-CM

## 2023-09-03 MED ORDER — NA SULFATE-K SULFATE-MG SULF 17.5-3.13-1.6 GM/177ML PO SOLN: 1.0000 | Freq: Once | ORAL | 0 refills | Status: AC

## 2023-09-03 NOTE — Telephone Encounter (Signed)
 Gastroenterology Pre-Procedure Review  Request Date: 12/23/2023 Requesting Physician: Dr. Jinny  PATIENT REVIEW QUESTIONS: The patient responded to the following health history questions as indicated:    1. Are you having any GI issues? no 2. Do you have a personal history of Polyps? yes (last colonoscopy with Dr Unk on 12/13/2020) 3. Do you have a family history of Colon Cancer or Polyps? no 4. Diabetes Mellitus? no 5. Joint replacements in the past 12 months?no 6. Major health problems in the past 3 months?no 7. Any artificial heart valves, MVP, or defibrillator?no    MEDICATIONS & ALLERGIES:    Patient reports the following regarding taking any anticoagulation/antiplatelet therapy:   Plavix, Coumadin, Eliquis, Xarelto, Lovenox, Pradaxa, Brilinta, or Effient? no Aspirin? no  Patient confirms/reports the following medications:  Current Outpatient Medications  Medication Sig Dispense Refill   Na Sulfate-K Sulfate-Mg Sulfate concentrate (SUPREP) 17.5-3.13-1.6 GM/177ML SOLN Take 1 kit (354 mLs total) by mouth once for 1 dose. 354 mL 0   buPROPion  (WELLBUTRIN  XL) 150 MG 24 hr tablet Take 1 tablet (150 mg total) by mouth daily. 90 tablet 3   calcium carbonate (OS-CAL) 1250 (500 Ca) MG chewable tablet Chew 500 mg by mouth daily.     cholecalciferol (VITAMIN D3) 25 MCG (1000 UT) tablet Take 1,000 Units by mouth daily.     ferrous sulfate 325 (65 FE) MG EC tablet Take 1 tablet by mouth daily.     fluticasone (FLONASE) 50 MCG/ACT nasal spray Place into the nose.     omeprazole  (PRILOSEC) 20 MG capsule Take 20 mg by mouth 2 (two) times daily before a meal.     progesterone  (PROMETRIUM ) 200 MG capsule Take 200 mg by mouth at bedtime.     sertraline  (ZOLOFT ) 25 MG tablet Take 1 tablet (25 mg total) by mouth daily. 90 tablet 3   No current facility-administered medications for this visit.    Patient confirms/reports the following allergies:  Allergies  Allergen Reactions   Ibuprofen      No orders of the defined types were placed in this encounter.   AUTHORIZATION INFORMATION Primary Insurance: 1D#: Group #:  Secondary Insurance: 1D#: Group #:  SCHEDULE INFORMATION: Date: 12/23/2023 Time: Location: [facility]

## 2023-09-10 NOTE — Telephone Encounter (Signed)
 Colonoscopy has been rescheduled from 12/23/23 to 12/16.  Husband has to work.  Referral updated.

## 2023-11-03 ENCOUNTER — Emergency Department
Admission: EM | Admit: 2023-11-03 | Discharge: 2023-11-03 | Disposition: A | Discharge status: Home / Self Care | Payer: PRIVATE HEALTH INSURANCE | Attending: Emergency Medicine | Admitting: Emergency Medicine

## 2023-11-03 ENCOUNTER — Other Ambulatory Visit: Payer: Self-pay

## 2023-11-03 ENCOUNTER — Emergency Department: Payer: PRIVATE HEALTH INSURANCE

## 2023-11-03 DIAGNOSIS — R319 Hematuria, unspecified: Secondary | ICD-10-CM | POA: Insufficient documentation

## 2023-11-03 DIAGNOSIS — R10A1 Flank pain, right side: Secondary | ICD-10-CM | POA: Diagnosis not present

## 2023-11-03 DIAGNOSIS — R1031 Right lower quadrant pain: Secondary | ICD-10-CM | POA: Diagnosis present

## 2023-11-03 LAB — COMPREHENSIVE METABOLIC PANEL WITH GFR
ALT: 12 U/L (ref 0–44)
AST: 19 U/L (ref 15–41)
Albumin: 4.1 g/dL (ref 3.5–5.0)
Alkaline Phosphatase: 52 U/L (ref 38–126)
Anion gap: 11 (ref 5–15)
BUN: 18 mg/dL (ref 6–20)
CO2: 23 mmol/L (ref 22–32)
Calcium: 8.9 mg/dL (ref 8.9–10.3)
Chloride: 105 mmol/L (ref 98–111)
Creatinine, Ser: 0.87 mg/dL (ref 0.44–1.00)
GFR, Estimated: >60 mL/min (ref >60)
Glucose, Bld: 117 mg/dL — ABNORMAL HIGH (ref 70–99)
Potassium: 3.7 mmol/L (ref 3.5–5.1)
Sodium: 139 mmol/L (ref 135–145)
Total Bilirubin: 0.6 mg/dL (ref 0.0–1.2)
Total Protein: 6.9 g/dL (ref 6.5–8.1)

## 2023-11-03 LAB — URINALYSIS, ROUTINE W REFLEX MICROSCOPIC
Bacteria, UA: NONE SEEN
Bilirubin Urine: NEGATIVE
Glucose, UA: NEGATIVE mg/dL
Ketones, ur: NEGATIVE mg/dL
Leukocytes,Ua: NEGATIVE
Nitrite: NEGATIVE
Protein, ur: NEGATIVE mg/dL
Specific Gravity, Urine: 1.023 (ref 1.005–1.030)
pH: 6 (ref 5.0–8.0)

## 2023-11-03 LAB — CBC
HCT: 41.4 % (ref 36.0–46.0)
Hemoglobin: 13.6 g/dL (ref 12.0–15.0)
MCH: 28.6 pg (ref 26.0–34.0)
MCHC: 32.9 g/dL (ref 30.0–36.0)
MCV: 87 fL (ref 80.0–100.0)
Platelets: 235 K/uL (ref 150–400)
RBC: 4.76 MIL/uL (ref 3.87–5.11)
RDW: 13.2 % (ref 11.5–15.5)
WBC: 5.9 K/uL (ref 4.0–10.5)
nRBC: 0 % (ref 0.0–0.2)

## 2023-11-03 LAB — LIPASE, BLOOD: Lipase: 44 U/L (ref 11–51)

## 2023-11-03 MED ORDER — CYCLOBENZAPRINE HCL 10 MG PO TABS: 10.0000 mg | ORAL_TABLET | Freq: Three times a day (TID) | ORAL | 0 refills | Status: AC | PRN

## 2023-11-03 MED ORDER — HYDROCODONE-ACETAMINOPHEN 5-325 MG PO TABS
1.0000 | ORAL_TABLET | Freq: Once | ORAL | Status: AC
  Administered 2023-11-03: 1 via ORAL
  Filled 2023-11-03: qty 1

## 2023-11-03 MED ORDER — KETOROLAC TROMETHAMINE 15 MG/ML IJ SOLN
15.0000 mg | Freq: Once | INTRAMUSCULAR | Status: AC
  Administered 2023-11-03: 15 mg via INTRAMUSCULAR
  Filled 2023-11-03: qty 1

## 2023-11-03 MED ORDER — DICYCLOMINE HCL 20 MG PO TABS: 20.0000 mg | ORAL_TABLET | Freq: Four times a day (QID) | ORAL | 0 refills | Status: AC | PRN

## 2023-11-03 NOTE — Discharge Instructions (Signed)
 You are seen in the ER today for evaluation of your flank pain.  Your labs and CT scan were fortunately reassuring against an emergency cause for this.  Your pain may have been related to a renal stone that you have passed or strain of your muscles.  I sent a prescription of an anticramping medicine as well as a muscle laxer to your pharmacy.  Muscle relaxer can make you drowsy, do not drive rapid machinery when taking this.  Follow with your primary care doctor for further evaluation.  Return to the ER for new or worsening symptoms.

## 2023-11-03 NOTE — ED Triage Notes (Signed)
 Pt comes with RLQ pain for last 24 hours. Pt states hurts when taking in deep breath. Pt denies any NV

## 2023-11-03 NOTE — ED Provider Notes (Signed)
 [facility] Provider Note    Event Date/Time   First MD Initiated Contact with Patient 11/03/23 1124     (approximate)   History   RLQ pain   HPI  Brianna Wolf is a 58 year old female presenting to the emergency department for evaluation of abdominal pain.  Yesterday patient noticed some discomfort in her right lower quadrant/right flank area.  Thought initially to be gas but persisted throughout the day.  Pain is worse in her right flank region.  No associated nausea or vomiting.  Reports distal history of kidney stone.  Prior hysterectomy, still has her gallbladder and appendix.     Physical Exam   Triage Vital Signs: ED Triage Vitals  Encounter Vitals Group     BP 11/03/23 1102 (!) 173/78     Girls Systolic BP Percentile --      Girls Diastolic BP Percentile --      Boys Systolic BP Percentile --      Boys Diastolic BP Percentile --      Pulse Rate 11/03/23 1102 71     Resp 11/03/23 1102 18     Temp 11/03/23 1102 98 F (36.7 C)     Temp src --      SpO2 11/03/23 1102 100 %     Weight 11/03/23 1101 150 lb (68 kg)     Height 11/03/23 1101 5' 3 (1.6 m)     Head Circumference --      Peak Flow --      Pain Score 11/03/23 1101 6     Pain Loc --      Pain Education --      Exclude from Growth Chart --     Most recent vital signs: Vitals:   11/03/23 1102 11/03/23 1146  BP: (!) 173/78   Pulse: 71   Resp: 18   Temp: 98 F (36.7 C)   SpO2: 100% 100%     General: Awake, interactive, appears uncomfortable CV:  Good peripheral perfusion Resp:  Unlabored respirations Abd:  Nondistended, soft, nontender to palpation, reports pain is worse in her right flank but no clear reproducible tenderness to palpation on exam Neuro:  Symmetric facial movement, fluid speech   ED Results / Procedures / Treatments   Labs (all labs ordered are listed, but only abnormal results are displayed) Labs Reviewed  COMPREHENSIVE METABOLIC PANEL WITH GFR -  Abnormal; Notable for the following components:      Result Value   Glucose, Bld 117 (*)    All other components within normal limits  URINALYSIS, ROUTINE W REFLEX MICROSCOPIC - Abnormal; Notable for the following components:   Color, Urine YELLOW (*)    APPearance HAZY (*)    Hgb urine dipstick SMALL (*)    All other components within normal limits  LIPASE, BLOOD  CBC     EKG EKG independently reviewed and interpreted by myself demonstrates:    RADIOLOGY Imaging independently reviewed and interpreted by myself demonstrates:  CT abdomen pelvis without obstructing stone  Formal Radiology Read:  CT Renal Stone Study Result Date: 11/03/2023 EXAM: CT UROGRAM 11/03/2023 12:27:10 PM TECHNIQUE: CT of the abdomen and pelvis was performed without intravenous contrast as per CT urogram protocol. Multiplanar reformatted images as well as MIP urogram images are provided for review. Automated exposure control, iterative reconstruction, and/or weight based adjustment of the mA/kV was utilized to reduce the radiation dose to as low as reasonably achievable. COMPARISON: Right upper quadrant ultrasound dated  09/26/2018. CLINICAL HISTORY: Abdominal/flank pain, stone suspected; R flank pain. RT flank pain; partial hysterectomy no ca. FINDINGS: LOWER CHEST: No acute abnormality. LIVER: The liver is unremarkable. GALLBLADDER AND BILE DUCTS: Gallbladder is unremarkable. No biliary ductal dilatation. SPLEEN: No acute abnormality. PANCREAS: No acute abnormality. ADRENAL GLANDS: No acute abnormality. KIDNEYS, URETERS AND BLADDER: No stones in the kidneys or ureters. No hydronephrosis. No perinephric or periureteral stranding. Urinary bladder is unremarkable. GI AND BOWEL: Stomach demonstrates no acute abnormality. There is no bowel obstruction. Normal appendix, included on coronal image 53. PERITONEUM AND RETROPERITONEUM: No ascites. No free air. VASCULATURE: Aorta is normal in caliber. LYMPH NODES: No  lymphadenopathy. REPRODUCTIVE ORGANS: Hysterectomy. BONES AND SOFT TISSUES: No acute osseous abnormality. No focal soft tissue abnormality. IMPRESSION: 1. No urinary tract calculi or hydronephrosis; no explanation for right-sided pain. 2. Normal appendix. Electronically signed by: Rockey Kilts MD 11/03/2023 12:58 PM EDT RP Workstation: [id number]    PROCEDURES:  Critical Care performed: No  Procedures   MEDICATIONS ORDERED IN ED: Medications  HYDROcodone -acetaminophen  (NORCO/VICODIN) 5-325 MG per tablet 1 tablet (1 tablet Oral Given 11/03/23 1155)  ketorolac (TORADOL) 15 MG/ML injection 15 mg (15 mg Intramuscular Given 11/03/23 1156)     IMPRESSION / MDM / ASSESSMENT AND PLAN / ED COURSE  I reviewed the triage vital signs and the nursing notes.  Differential diagnosis includes, but is not limited to, renal stone, much lower suspicion biliary pathology, appendicitis, other acute intra-abdominal process given reassuring abdominal exam  Patient's presentation is most consistent with acute presentation with potential threat to life or bodily function.  59 year old female presenting to the emergency department for evaluation of right flank pain.  Stable vitals on presentation.  CT ordered to further evaluate.  Reassuring CBC, CMP, lipase.  Urine pending.  Will treat symptomatically with Norco and Toradol and obtain CT to further evaluate.    Clinical Course as of 11/03/23 1326  Sun Nov 03, 2023  1324 CT without evidence of renal stone or other acute findings.  Urinalysis with small hematuria, no evidence of infection.  Consideration for passed renal stone, musculoskeletal strain.  Patient updated on results of workup.  Does report that she has some ongoing pain with positional changes as well as some crampy type pain.  Low suspicion emergent process.  Patient is comfortable with discharge home.  Will DC with prescription for muscle relaxer for possible musculoskeletal strain as well as  dicyclomine for crampy pain.  Has a primary care doctor she can follow-up with.  Strict return precautions provided.  Patient discharged in stable condition. [NR]    Clinical Course User Index [NR] Levander Slate, MD     FINAL CLINICAL IMPRESSION(S) / ED DIAGNOSES   Final diagnoses:  Right flank pain     Rx / DC Orders   ED Discharge Orders          Ordered    cyclobenzaprine (FLEXERIL) 10 MG tablet  3 times daily PRN        11/03/23 1326    dicyclomine (BENTYL) 20 MG tablet  Every 6 hours PRN        11/03/23 1326             Note:  This document was prepared using Dragon voice recognition software and may include unintentional dictation errors.   Levander Slate, MD 11/03/23 [phone]

## 2023-12-26 ENCOUNTER — Other Ambulatory Visit: Payer: Self-pay | Admitting: Medical Genetics

## 2023-12-28 ENCOUNTER — Inpatient Hospital Stay
Admission: RE | Admit: 2023-12-28 | Discharge: 2023-12-28 | Payer: Self-pay | Attending: Medical Genetics | Admitting: Medical Genetics

## 2024-01-02 ENCOUNTER — Ambulatory Visit
Admission: RE | Admit: 2024-01-02 | Discharge: 2024-01-02 | Disposition: A | Discharge status: Home / Self Care | Payer: PRIVATE HEALTH INSURANCE | Attending: Gastroenterology | Admitting: Gastroenterology

## 2024-01-02 ENCOUNTER — Ambulatory Visit: Payer: PRIVATE HEALTH INSURANCE | Admitting: Anesthesiology

## 2024-01-02 ENCOUNTER — Other Ambulatory Visit: Payer: Self-pay

## 2024-01-02 ENCOUNTER — Other Ambulatory Visit: Payer: Self-pay | Admitting: Gastroenterology

## 2024-01-02 ENCOUNTER — Encounter: Payer: Self-pay | Admitting: Gastroenterology

## 2024-01-02 ENCOUNTER — Encounter: Admission: RE | Disposition: A | Payer: Self-pay | Attending: Gastroenterology

## 2024-01-02 DIAGNOSIS — D125 Benign neoplasm of sigmoid colon: Secondary | ICD-10-CM | POA: Diagnosis not present

## 2024-01-02 DIAGNOSIS — D122 Benign neoplasm of ascending colon: Secondary | ICD-10-CM | POA: Diagnosis not present

## 2024-01-02 DIAGNOSIS — K635 Polyp of colon: Secondary | ICD-10-CM | POA: Diagnosis present

## 2024-01-02 DIAGNOSIS — Z860101 Personal history of adenomatous and serrated colon polyps: Secondary | ICD-10-CM | POA: Diagnosis not present

## 2024-01-02 DIAGNOSIS — D120 Benign neoplasm of cecum: Secondary | ICD-10-CM | POA: Diagnosis not present

## 2024-01-02 DIAGNOSIS — Z1211 Encounter for screening for malignant neoplasm of colon: Secondary | ICD-10-CM | POA: Insufficient documentation

## 2024-01-02 DIAGNOSIS — Z860100 Personal history of colon polyps, unspecified: Secondary | ICD-10-CM

## 2024-01-02 HISTORY — PX: POLYPECTOMY: SHX149

## 2024-01-02 HISTORY — PX: COLONOSCOPY: SHX5424

## 2024-01-02 SURGERY — COLONOSCOPY
Anesthesia: General

## 2024-01-02 MED ORDER — PROPOFOL 1000 MG/100ML IV EMUL
INTRAVENOUS | Status: AC
  Filled 2024-01-02: qty 100

## 2024-01-02 MED ORDER — LIDOCAINE HCL (PF) 2 % IJ SOLN
INTRAMUSCULAR | Status: AC
  Filled 2024-01-02: qty 5

## 2024-01-02 MED ORDER — DEXMEDETOMIDINE HCL IN NACL 80 MCG/20ML IV SOLN
INTRAVENOUS | Status: AC
  Filled 2024-01-02: qty 20

## 2024-01-02 MED ORDER — LIDOCAINE HCL (CARDIAC) PF 100 MG/5ML IV SOSY
PREFILLED_SYRINGE | INTRAVENOUS | Status: DC | PRN
  Administered 2024-01-02: 08:00:00 60 mg via INTRAVENOUS

## 2024-01-02 MED ORDER — PROPOFOL 500 MG/50ML IV EMUL
INTRAVENOUS | Status: DC | PRN
  Administered 2024-01-02: 08:00:00 75 ug/kg/min via INTRAVENOUS

## 2024-01-02 MED ORDER — PROPOFOL 10 MG/ML IV BOLUS
INTRAVENOUS | Status: DC | PRN
  Administered 2024-01-02 (×2): 50 mg via INTRAVENOUS

## 2024-01-02 MED ORDER — DEXMEDETOMIDINE HCL IN NACL 80 MCG/20ML IV SOLN
INTRAVENOUS | Status: DC | PRN
  Administered 2024-01-02: 08:00:00 8 ug via INTRAVENOUS
  Administered 2024-01-02: 08:00:00 12 ug via INTRAVENOUS

## 2024-01-02 MED ORDER — SODIUM CHLORIDE 0.9 % IV SOLN: INTRAVENOUS | Status: DC

## 2024-01-02 NOTE — Transfer of Care (Signed)
 Immediate Anesthesia Transfer of Care Note  Patient: Brianna Wolf  Procedure(s) Performed: COLONOSCOPY POLYPECTOMY, INTESTINE  Patient Location: PACU  Anesthesia Type:General  Level of Consciousness: sedated  Airway & Oxygen Therapy: Patient Spontanous Breathing  Post-op Assessment: Report given to RN and Post -op Vital signs reviewed and stable  Post vital signs: Reviewed and stable  Last Vitals:  Vitals Value Taken Time  BP    Temp    Pulse    Resp 17 01/02/24 08:33  SpO2    Vitals shown include unfiled device data.  Last Pain:  Vitals:   01/02/24 0712  TempSrc: Temporal  PainSc: 0-No pain         Complications: No notable events documented.

## 2024-01-02 NOTE — Anesthesia Postprocedure Evaluation (Signed)
 Anesthesia Post Note  Patient: Brianna Wolf  Procedure(s) Performed: COLONOSCOPY POLYPECTOMY, INTESTINE  Patient location during evaluation: PACU Anesthesia Type: General Level of consciousness: awake and alert Pain management: pain level controlled Vital Signs Assessment: post-procedure vital signs reviewed and stable Respiratory status: spontaneous breathing, nonlabored ventilation, respiratory function stable and patient connected to nasal cannula oxygen Cardiovascular status: blood pressure returned to baseline and stable Postop Assessment: no apparent nausea or vomiting Anesthetic complications: no   No notable events documented.   Last Vitals:  Vitals:   01/02/24 0833 01/02/24 0834  BP:  106/89  Pulse:    Resp: 17 16  Temp:    SpO2:      Last Pain:  Vitals:   01/02/24 0712  TempSrc: Temporal  PainSc: 0-No pain                 Lynwood KANDICE Clause
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
## 2024-01-02 NOTE — Op Note (Signed)
 [facility] Gastroenterology Patient Name: Brianna Wolf Procedure Date: 01/02/2024 6:58 AM MRN: [id number] Account #: [id number] Date of Birth: 1965/01/17 Admit Type: Outpatient Age: 58 Room: [facility] ENDO ROOM 4 Gender: Female Note Status: Finalized Instrument Name: Colon Scope [id number] Procedure:             Colonoscopy Indications:           High risk colon cancer surveillance: Personal history                         of colonic polyps Providers:             Rogelia Copping MD, MD Referring MD:          Narvis Fees PA Medicines:             Propofol  per Anesthesia Complications:         No immediate complications. Procedure:             Pre-Anesthesia Assessment:                        - Prior to the procedure, a History and Physical was                         performed, and patient medications and allergies were                         reviewed. The patient's tolerance of previous                         anesthesia was also reviewed. The risks and benefits                         of the procedure and the sedation options and risks                         were discussed with the patient. All questions were                         answered, and informed consent was obtained. Prior                         Anticoagulants: The patient has taken no anticoagulant                         or antiplatelet agents. ASA Grade Assessment: II - A                         patient with mild systemic disease. After reviewing                         the risks and benefits, the patient was deemed in                         satisfactory condition to undergo the procedure.                        After obtaining informed consent, the colonoscope was  passed under direct vision. Throughout the procedure,                         the patient's blood pressure, pulse, and oxygen                         saturations were monitored continuously. The                          Colonoscope was introduced through the anus and                         advanced to the the cecum, identified by appendiceal                         orifice and ileocecal valve. The colonoscopy was                         performed without difficulty. The patient tolerated                         the procedure well. The quality of the bowel                         preparation was excellent. Findings:      The perianal and digital rectal examinations were normal.      A 1 mm polyp was found in the cecum. The polyp was sessile. The polyp       was removed with a cold snare. Resection was complete, but the polyp       tissue was not retrieved.      A 1 mm polyp was found in the ascending colon. The polyp was sessile.       The polyp was removed with a cold snare. Resection was complete, but the       polyp tissue was not retrieved.      A 4 mm polyp was found in the transverse colon. The polyp was sessile.       The polyp was removed with a cold snare. Resection and retrieval were       complete.      A 3 mm polyp was found in the sigmoid colon. The polyp was sessile. The       polyp was removed with a cold snare. Resection and retrieval were       complete. Impression:            - One 1 mm polyp in the cecum, removed with a cold                         snare. Complete resection. Polyp tissue not retrieved.                        - One 1 mm polyp in the ascending colon, removed with                         a cold snare. Complete resection. Polyp tissue not                         retrieved.                        -  One 4 mm polyp in the transverse colon, removed with                         a cold snare. Resected and retrieved.                        - One 3 mm polyp in the sigmoid colon, removed with a                         cold snare. Resected and retrieved. Recommendation:        - Discharge patient to home.                        - Resume previous diet.                        -  Continue present medications.                        - Await pathology results.                        - Repeat colonoscopy in 5 years for surveillance. Procedure Code(s):     --- Professional ---                        [id number], Colonoscopy, flexible; with removal of                         tumor(s), polyp(s), or other lesion(s) by snare                         technique Diagnosis Code(s):     --- Professional ---                        Z86.010, Personal history of colonic polyps                        D12.5, Benign neoplasm of sigmoid colon CPT copyright 2022 [employer]. All rights reserved. The codes documented in this report are preliminary and upon coder review may  be revised to meet current compliance requirements. Rogelia Copping MD, MD 01/02/2024 8:36:38 AM This report has been signed electronically. Number of Addenda: 0 Note Initiated On: 01/02/2024 6:58 AM Scope Withdrawal Time: 0 hours 10 minutes 28 seconds  Total Procedure Duration: 0 hours 14 minutes 44 seconds  Estimated Blood Loss:  Estimated blood loss: none.      [facility]

## 2024-01-04 LAB — SURGICAL PATHOLOGY

## 2024-01-07 ENCOUNTER — Ambulatory Visit: Payer: Self-pay | Admitting: Gastroenterology

## 2024-01-07 LAB — GENECONNECT MOLECULAR SCREEN: Genetic Analysis Overall Interpretation: NEGATIVE

## 2024-01-27 ENCOUNTER — Other Ambulatory Visit: Payer: Self-pay | Admitting: Family Medicine

## 2024-01-27 DIAGNOSIS — Z1231 Encounter for screening mammogram for malignant neoplasm of breast: Secondary | ICD-10-CM

## 2024-02-21 ENCOUNTER — Ambulatory Visit: Admission: RE | Admit: 2024-02-21 | Payer: PRIVATE HEALTH INSURANCE | Source: Ambulatory Visit

## 2024-02-21 DIAGNOSIS — Z1231 Encounter for screening mammogram for malignant neoplasm of breast: Secondary | ICD-10-CM
